# Patient Record
Sex: Female | Born: 1990
Health system: Southern US, Community
[De-identification: ages and names within clinical notes are randomized; demographics above are authoritative.]

## PROBLEM LIST (undated history)

## (undated) DIAGNOSIS — F410 Panic disorder [episodic paroxysmal anxiety] without agoraphobia: Secondary | ICD-10-CM

## (undated) DIAGNOSIS — F4310 Post-traumatic stress disorder, unspecified: Secondary | ICD-10-CM

## (undated) DIAGNOSIS — K602 Anal fissure, unspecified: Secondary | ICD-10-CM

## (undated) DIAGNOSIS — D126 Benign neoplasm of colon, unspecified: Secondary | ICD-10-CM

## (undated) DIAGNOSIS — F329 Major depressive disorder, single episode, unspecified: Secondary | ICD-10-CM

## (undated) DIAGNOSIS — F909 Attention-deficit hyperactivity disorder, unspecified type: Secondary | ICD-10-CM

## (undated) HISTORY — PX: NO PAST SURGERIES: SHX2092

## (undated) HISTORY — DX: Post-traumatic stress disorder, unspecified: F43.10

## (undated) HISTORY — DX: Attention-deficit hyperactivity disorder, unspecified type: F90.9

## (undated) HISTORY — DX: Major depressive disorder, single episode, unspecified: F32.9

---

## 2017-08-05 ENCOUNTER — Other Ambulatory Visit: Payer: Self-pay

## 2017-08-05 ENCOUNTER — Encounter (HOSPITAL_COMMUNITY): Payer: Self-pay | Admitting: *Deleted

## 2017-08-05 ENCOUNTER — Encounter (HOSPITAL_COMMUNITY): Payer: Self-pay | Admitting: Emergency Medicine

## 2017-08-05 ENCOUNTER — Emergency Department (HOSPITAL_COMMUNITY): Payer: Commercial Managed Care - PPO

## 2017-08-05 ENCOUNTER — Emergency Department (HOSPITAL_COMMUNITY)
Admission: EM | Admit: 2017-08-05 | Discharge: 2017-08-05 | Disposition: A | Discharge status: Other Acute Inpatient Hospital | Payer: Commercial Managed Care - PPO | Attending: Emergency Medicine | Admitting: Emergency Medicine

## 2017-08-05 ENCOUNTER — Inpatient Hospital Stay (HOSPITAL_COMMUNITY)
Admission: AD | Admit: 2017-08-05 | Discharge: 2017-08-09 | DRG: 885 | Disposition: A | Discharge status: Home / Self Care | Payer: Commercial Managed Care - PPO | Attending: Psychiatry | Admitting: Psychiatry

## 2017-08-05 DIAGNOSIS — F10129 Alcohol abuse with intoxication, unspecified: Secondary | ICD-10-CM | POA: Diagnosis present

## 2017-08-05 DIAGNOSIS — X749XXD Intentional self-harm by unspecified firearm discharge, subsequent encounter: Secondary | ICD-10-CM

## 2017-08-05 DIAGNOSIS — S41002A Unspecified open wound of left shoulder, initial encounter: Secondary | ICD-10-CM | POA: Insufficient documentation

## 2017-08-05 DIAGNOSIS — Y999 Unspecified external cause status: Secondary | ICD-10-CM | POA: Diagnosis not present

## 2017-08-05 DIAGNOSIS — F1099 Alcohol use, unspecified with unspecified alcohol-induced disorder: Secondary | ICD-10-CM | POA: Diagnosis not present

## 2017-08-05 DIAGNOSIS — F1210 Cannabis abuse, uncomplicated: Secondary | ICD-10-CM | POA: Diagnosis not present

## 2017-08-05 DIAGNOSIS — Y929 Unspecified place or not applicable: Secondary | ICD-10-CM | POA: Insufficient documentation

## 2017-08-05 DIAGNOSIS — F39 Unspecified mood [affective] disorder: Secondary | ICD-10-CM | POA: Diagnosis not present

## 2017-08-05 DIAGNOSIS — S42292B Other displaced fracture of upper end of left humerus, initial encounter for open fracture: Secondary | ICD-10-CM | POA: Insufficient documentation

## 2017-08-05 DIAGNOSIS — Y906 Blood alcohol level of 120-199 mg/100 ml: Secondary | ICD-10-CM | POA: Diagnosis present

## 2017-08-05 DIAGNOSIS — S41031A Puncture wound without foreign body of right shoulder, initial encounter: Secondary | ICD-10-CM | POA: Diagnosis not present

## 2017-08-05 DIAGNOSIS — F419 Anxiety disorder, unspecified: Secondary | ICD-10-CM | POA: Diagnosis not present

## 2017-08-05 DIAGNOSIS — F332 Major depressive disorder, recurrent severe without psychotic features: Principal | ICD-10-CM | POA: Diagnosis present

## 2017-08-05 DIAGNOSIS — R45851 Suicidal ideations: Secondary | ICD-10-CM | POA: Insufficient documentation

## 2017-08-05 DIAGNOSIS — T1491XA Suicide attempt, initial encounter: Secondary | ICD-10-CM | POA: Insufficient documentation

## 2017-08-05 DIAGNOSIS — S41002D Unspecified open wound of left shoulder, subsequent encounter: Secondary | ICD-10-CM | POA: Diagnosis not present

## 2017-08-05 DIAGNOSIS — S42252B Displaced fracture of greater tuberosity of left humerus, initial encounter for open fracture: Secondary | ICD-10-CM | POA: Diagnosis not present

## 2017-08-05 DIAGNOSIS — F514 Sleep terrors [night terrors]: Secondary | ICD-10-CM | POA: Diagnosis present

## 2017-08-05 DIAGNOSIS — X749XXA Intentional self-harm by unspecified firearm discharge, initial encounter: Secondary | ICD-10-CM | POA: Diagnosis not present

## 2017-08-05 DIAGNOSIS — Y9389 Activity, other specified: Secondary | ICD-10-CM | POA: Insufficient documentation

## 2017-08-05 DIAGNOSIS — X730XXA Intentional self-harm by shotgun discharge, initial encounter: Secondary | ICD-10-CM | POA: Diagnosis not present

## 2017-08-05 DIAGNOSIS — Z630 Problems in relationship with spouse or partner: Secondary | ICD-10-CM | POA: Diagnosis not present

## 2017-08-05 DIAGNOSIS — R450 Nervousness: Secondary | ICD-10-CM | POA: Diagnosis not present

## 2017-08-05 DIAGNOSIS — Z915 Personal history of self-harm: Secondary | ICD-10-CM

## 2017-08-05 DIAGNOSIS — F4310 Post-traumatic stress disorder, unspecified: Secondary | ICD-10-CM | POA: Diagnosis present

## 2017-08-05 DIAGNOSIS — W3400XA Accidental discharge from unspecified firearms or gun, initial encounter: Secondary | ICD-10-CM

## 2017-08-05 HISTORY — DX: Post-traumatic stress disorder, unspecified: F43.10

## 2017-08-05 LAB — TYPE AND SCREEN
UNIT DIVISION: 0
Unit division: 0

## 2017-08-05 LAB — CBC
HEMATOCRIT: 42.8 % (ref 36.0–46.0)
HEMOGLOBIN: 14.5 g/dL (ref 12.0–15.0)
MCH: 30.9 pg (ref 26.0–34.0)
MCHC: 33.9 g/dL (ref 30.0–36.0)
MCV: 91.1 fL (ref 78.0–100.0)
Platelets: 250 10*3/uL (ref 150–400)
RBC: 4.7 MIL/uL (ref 3.87–5.11)
RDW: 12.6 % (ref 11.5–15.5)
WBC: 9 10*3/uL (ref 4.0–10.5)

## 2017-08-05 LAB — URINALYSIS, ROUTINE W REFLEX MICROSCOPIC
Bacteria, UA: NONE SEEN
Bilirubin Urine: NEGATIVE
GLUCOSE, UA: NEGATIVE mg/dL
KETONES UR: 5 mg/dL — AB
LEUKOCYTES UA: NEGATIVE
NITRITE: NEGATIVE
PH: 5 (ref 5.0–8.0)
PROTEIN: NEGATIVE mg/dL
Specific Gravity, Urine: 1.012 (ref 1.005–1.030)

## 2017-08-05 LAB — COMPREHENSIVE METABOLIC PANEL
ALBUMIN: 4.6 g/dL (ref 3.5–5.0)
ALT: 24 U/L (ref 14–54)
AST: 25 U/L (ref 15–41)
Alkaline Phosphatase: 41 U/L (ref 38–126)
Anion gap: 12 (ref 5–15)
BILIRUBIN TOTAL: 0.6 mg/dL (ref 0.3–1.2)
BUN: 8 mg/dL (ref 6–20)
CO2: 19 mmol/L — ABNORMAL LOW (ref 22–32)
Calcium: 9.3 mg/dL (ref 8.9–10.3)
Chloride: 110 mmol/L (ref 101–111)
Creatinine, Ser: 0.78 mg/dL (ref 0.44–1.00)
GFR calc Af Amer: >60 mL/min (ref >60)
GLUCOSE: 101 mg/dL — AB (ref 65–99)
POTASSIUM: 3.9 mmol/L (ref 3.5–5.1)
Sodium: 141 mmol/L (ref 135–145)
TOTAL PROTEIN: 7.6 g/dL (ref 6.5–8.1)

## 2017-08-05 LAB — BPAM FFP
Blood Product Expiration Date: 201901012359
Blood Product Expiration Date: 201901012359
ISSUE DATE / TIME: 201901010143
ISSUE DATE / TIME: 201901010143
Unit Type and Rh: 6200
Unit Type and Rh: 6200

## 2017-08-05 LAB — I-STAT CG4 LACTIC ACID, ED: LACTIC ACID, VENOUS: 2.4 mmol/L — AB (ref 0.5–1.9)

## 2017-08-05 LAB — I-STAT CHEM 8, ED
BUN: 9 mg/dL (ref 6–20)
Calcium, Ion: 1.14 mmol/L — ABNORMAL LOW (ref 1.15–1.40)
Chloride: 110 mmol/L (ref 101–111)
Creatinine, Ser: 1 mg/dL (ref 0.44–1.00)
Glucose, Bld: 101 mg/dL — ABNORMAL HIGH (ref 65–99)
HCT: 44 % (ref 36.0–46.0)
Hemoglobin: 15 g/dL (ref 12.0–15.0)
Potassium: 3.9 mmol/L (ref 3.5–5.1)
SODIUM: 144 mmol/L (ref 135–145)
TCO2: 20 mmol/L — ABNORMAL LOW (ref 22–32)

## 2017-08-05 LAB — I-STAT BETA HCG BLOOD, ED (MC, WL, AP ONLY): I-stat hCG, quantitative: <5 m[IU]/mL (ref <5)

## 2017-08-05 LAB — PREPARE FRESH FROZEN PLASMA
Unit division: 0
Unit division: 0

## 2017-08-05 LAB — BPAM RBC
Blood Product Expiration Date: 201901072359
Blood Product Expiration Date: 201901072359
ISSUE DATE / TIME: 201901010648
ISSUE DATE / TIME: 201901011018
UNIT TYPE AND RH: 9500
Unit Type and Rh: 9500

## 2017-08-05 LAB — ACETAMINOPHEN LEVEL

## 2017-08-05 LAB — RAPID URINE DRUG SCREEN, HOSP PERFORMED
AMPHETAMINES: NOT DETECTED
BARBITURATES: NOT DETECTED
Benzodiazepines: NOT DETECTED
COCAINE: NOT DETECTED
Opiates: NOT DETECTED
TETRAHYDROCANNABINOL: NOT DETECTED

## 2017-08-05 LAB — ETHANOL: Alcohol, Ethyl (B): 163 mg/dL — ABNORMAL HIGH (ref <10)

## 2017-08-05 LAB — BLOOD PRODUCT ORDER (VERBAL) VERIFICATION

## 2017-08-05 LAB — SALICYLATE LEVEL: Salicylate Lvl: <7 mg/dL (ref 2.8–30.0)

## 2017-08-05 MED ORDER — DOXYCYCLINE HYCLATE 100 MG PO TABS
100.0000 mg | ORAL_TABLET | Freq: Two times a day (BID) | ORAL | Status: DC
  Administered 2017-08-05 (×2): 100 mg via ORAL
  Filled 2017-08-05 (×2): qty 1

## 2017-08-05 MED ORDER — SODIUM CHLORIDE 0.9 % IV BOLUS (SEPSIS)
1000.0000 mL | Freq: Once | INTRAVENOUS | Status: AC
  Administered 2017-08-05: 1000 mL via INTRAVENOUS

## 2017-08-05 MED ORDER — NAPROXEN 500 MG PO TABS
500.0000 mg | ORAL_TABLET | Freq: Two times a day (BID) | ORAL | Status: DC
  Administered 2017-08-05 – 2017-08-09 (×8): 500 mg via ORAL
  Filled 2017-08-05 (×14): qty 1

## 2017-08-05 MED ORDER — ACETAMINOPHEN 500 MG PO TABS
1000.0000 mg | ORAL_TABLET | Freq: Four times a day (QID) | ORAL | Status: DC | PRN
  Administered 2017-08-05: 1000 mg via ORAL
  Filled 2017-08-05: qty 2

## 2017-08-05 MED ORDER — CEPHALEXIN 500 MG PO CAPS
500.0000 mg | ORAL_CAPSULE | Freq: Four times a day (QID) | ORAL | Status: DC
  Administered 2017-08-05 – 2017-08-09 (×14): 500 mg via ORAL
  Filled 2017-08-05 (×4): qty 1
  Filled 2017-08-05: qty 2
  Filled 2017-08-05: qty 20
  Filled 2017-08-05: qty 1
  Filled 2017-08-05 (×2): qty 20
  Filled 2017-08-05 (×10): qty 1
  Filled 2017-08-05: qty 20
  Filled 2017-08-05 (×2): qty 1
  Filled 2017-08-05: qty 2
  Filled 2017-08-05 (×2): qty 1

## 2017-08-05 MED ORDER — TRAMADOL HCL 50 MG PO TABS
50.0000 mg | ORAL_TABLET | Freq: Four times a day (QID) | ORAL | Status: DC | PRN
  Administered 2017-08-05 – 2017-08-08 (×4): 50 mg via ORAL
  Filled 2017-08-05 (×4): qty 1

## 2017-08-05 MED ORDER — KETOROLAC TROMETHAMINE 30 MG/ML IJ SOLN
15.0000 mg | Freq: Once | INTRAMUSCULAR | Status: AC
  Administered 2017-08-05: 15 mg via INTRAVENOUS
  Filled 2017-08-05: qty 1

## 2017-08-05 MED ORDER — MAGNESIUM HYDROXIDE 400 MG/5ML PO SUSP: 30.0000 mL | Freq: Every day | ORAL | Status: DC | PRN

## 2017-08-05 MED ORDER — CEPHALEXIN 250 MG PO CAPS
500.0000 mg | ORAL_CAPSULE | Freq: Four times a day (QID) | ORAL | Status: DC
  Administered 2017-08-05 (×2): 500 mg via ORAL
  Filled 2017-08-05 (×2): qty 2

## 2017-08-05 MED ORDER — DOXYCYCLINE HYCLATE 100 MG PO TABS
100.0000 mg | ORAL_TABLET | Freq: Two times a day (BID) | ORAL | Status: AC
  Administered 2017-08-05 – 2017-08-07 (×5): 100 mg via ORAL
  Filled 2017-08-05 (×7): qty 1

## 2017-08-05 MED ORDER — ALUM & MAG HYDROXIDE-SIMETH 200-200-20 MG/5ML PO SUSP: 30.0000 mL | ORAL | Status: DC | PRN

## 2017-08-05 MED ORDER — TRAMADOL HCL 50 MG PO TABS
50.0000 mg | ORAL_TABLET | Freq: Four times a day (QID) | ORAL | Status: DC | PRN
Start: 2017-08-05 — End: 2017-08-05
  Administered 2017-08-05 (×2): 50 mg via ORAL
  Filled 2017-08-05 (×2): qty 1

## 2017-08-05 MED ORDER — TETANUS-DIPHTH-ACELL PERTUSSIS 5-2.5-18.5 LF-MCG/0.5 IM SUSP
0.5000 mL | Freq: Once | INTRAMUSCULAR | Status: AC
  Administered 2017-08-05: 0.5 mL via INTRAMUSCULAR

## 2017-08-05 MED ORDER — IBUPROFEN 400 MG PO TABS: 600.0000 mg | ORAL_TABLET | Freq: Four times a day (QID) | ORAL | Status: DC | PRN

## 2017-08-05 MED ORDER — CEFAZOLIN SODIUM-DEXTROSE 1-4 GM/50ML-% IV SOLN
1.0000 g | Freq: Once | INTRAVENOUS | Status: AC
  Administered 2017-08-05: 1 g via INTRAVENOUS

## 2017-08-05 NOTE — ED Notes (Signed)
Per request of Dr. Randal Buba, called [employer] - Non Emergent Line to see if IVC had been taken out on pt by [facility] officers that brought the pt in. Dispatcher for [employer] - Non Emergent Line stated that they can not see if any IVC papers had been taken out. I was then transferred to the [place] who stated no IVC papers have been taken out on the pt within the last 24 hours.

## 2017-08-05 NOTE — ED Provider Notes (Signed)
Under IVC by me   Accepted to [facility] this am   Joshua Soulier, MD 08/05/17 [id number]
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
## 2017-08-05 NOTE — Tx Team (Signed)
Initial Treatment Plan 08/05/2017 3:46 PM [name] KLK:[id number]    PATIENT STRESSORS: Marital or family conflict Substance abuse   PATIENT STRENGTHS: Ability for insight Average or above average intelligence Capable of independent living General fund of knowledge Motivation for treatment/growth   PATIENT IDENTIFIED PROBLEMS: Depression Suicidal thoughts "Need to re-find my coping mechanisms"                     DISCHARGE CRITERIA:  Ability to meet basic life and health needs Improved stabilization in mood, thinking, and/or behavior Reduction of life-threatening or endangering symptoms to within safe limits Verbal commitment to aftercare and medication compliance  PRELIMINARY DISCHARGE PLAN: Attend aftercare/continuing care group Return to previous living arrangement  PATIENT/FAMILY INVOLVEMENT: This treatment plan has been presented to and reviewed with the patient, [facility], and/or family member, .  The patient and family have been given the opportunity to ask questions and make suggestions.  Kalen Neidert, [place], [place] 08/05/2017, 3:46 PM

## 2017-08-05 NOTE — ED Notes (Deleted)
Chaplain paged @ [phone]. Per request of [employer] manager, only fiance is allowed to come back w/ chaplain to the consultation room.

## 2017-08-05 NOTE — Plan of Care (Signed)
Patient demonstrates understanding of prescribed medications. Patient has been compliant with medication regimen and has no questions or concerns at this time.
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
## 2017-08-05 NOTE — ED Notes (Signed)
Confirmed w/ the Magistrate @ [phone] that they have received the IVC papers.

## 2017-08-05 NOTE — Progress Notes (Signed)
Per Letitia Libra , [facility], patient has been accepted to [facility], bed 400-1 ; Accepting provider is Patriciaann Clan, NP; Attending provider is Dr. Parke Poisson.   Patient can arrive at 3:00pm. Number for report is [phone].  Gordan Payment, RN notified.    Radonna Ricker, MSW, [place] Clinical Social Worker (Disposition) [facility]  [phone]

## 2017-08-05 NOTE — ED Notes (Signed)
Attempted to call report to [facility] - requested for this RN to call back in 20 minutes.

## 2017-08-05 NOTE — ED Notes (Signed)
IVC papers filled out. Faxed to [facility] @ 0525. Faxed to the [place] @ 0526. Awaiting confirmations on both.

## 2017-08-05 NOTE — ED Notes (Signed)
Pt arrived to [facility] - wearing hospital gown and paper scrub pants. Pt replaced gown w/paper shirt as requested. Pt noted to be alert, oriented, cooperative, calm. Sitter w/pt.
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
## 2017-08-05 NOTE — ED Notes (Signed)
TTS in process 

## 2017-08-05 NOTE — Progress Notes (Signed)
Pt has dressing to L shoulder.  Upon examination there is entry and exit wound with minor, red drainage.  Pt sts she has pain lever of 8 and pain when manipulating her are.  Pt is capable of ADL's without assistance. Dressing changed after pt has shower.  Pt receives pain medication order.  Sling ordered for pt. Pt in bed resting at this time.  15 min checks for safety.  Pt remains safe

## 2017-08-05 NOTE — ED Notes (Signed)
TTS machine set up for Tele assessment

## 2017-08-05 NOTE — ED Notes (Signed)
IV removed from rt. AC, 2X2 applied w/ co band. Patient was given a Regular Diet for Lunch.

## 2017-08-05 NOTE — ED Notes (Signed)
CSI at bedside.
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
## 2017-08-05 NOTE — ED Notes (Signed)
Successful fax confirmations received on IVC papers that were faxed to [facility] and the [facility].

## 2017-08-05 NOTE — ED Notes (Signed)
Copy of IVC paperwork sent to Medical Records, original placed in folder for Magistrate, and all 3 copies on clipboard.

## 2017-08-05 NOTE — ED Notes (Signed)
Pt's belongings placed in locker #2, no other valuables found with belongings.
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
## 2017-08-05 NOTE — ED Notes (Signed)
Regular diet breakfast tray ordered @ [phone] w/ 'no sharps' requested.

## 2017-08-06 DIAGNOSIS — X749XXA Intentional self-harm by unspecified firearm discharge, initial encounter: Secondary | ICD-10-CM

## 2017-08-06 DIAGNOSIS — T1491XA Suicide attempt, initial encounter: Secondary | ICD-10-CM

## 2017-08-06 DIAGNOSIS — F332 Major depressive disorder, recurrent severe without psychotic features: Principal | ICD-10-CM

## 2017-08-06 DIAGNOSIS — Z630 Problems in relationship with spouse or partner: Secondary | ICD-10-CM

## 2017-08-06 DIAGNOSIS — S41031A Puncture wound without foreign body of right shoulder, initial encounter: Secondary | ICD-10-CM

## 2017-08-06 MED ORDER — SERTRALINE HCL 25 MG PO TABS
25.0000 mg | ORAL_TABLET | Freq: Every day | ORAL | Status: DC
  Administered 2017-08-06 – 2017-08-09 (×4): 25 mg via ORAL
  Filled 2017-08-06: qty 7
  Filled 2017-08-06 (×6): qty 1

## 2017-08-06 NOTE — Plan of Care (Signed)
Patient verbalizes understanding of information, education provided.  Patient has not engaged in self harm, denies thoughts to do so.
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
## 2017-08-06 NOTE — Progress Notes (Signed)
Nursing Progress Note: 7p-7a D: Pt currently presents with a pleasant affect and behavior. Pt states "I have to stay away from alcohol. I feel just fine. It's just when I drink do I have bad reactions" Interacting appropriately with the milieu. Pt reports good sleep during the previous night with current medication regimen. Pt did attend wrap-up group.  A: Pt provided with medications per providers orders. Pt's labs and vitals were monitored throughout the night. Pt supported emotionally and encouraged to express concerns and questions. Pt educated on medications.  R: Pt's safety ensured with 15 minute and environmental checks. Pt currently denies SI, HI, and AVH. Pt verbally contracts to seek staff if SI,HI, or AVH occurs and to consult with staff before acting on any harmful thoughts. Will continue to monitor.

## 2017-08-06 NOTE — BHH Group Notes (Signed)
[facility] Mental Health Association Group Therapy 08/06/2017 1:15pm  Type of Therapy: Mental Health Association Presentation  Participation Level: Active  Participation Quality: Attentive  Affect: Appropriate  Cognitive: Oriented  Insight: Developing/Improving  Engagement in Therapy: Engaged  Modes of Intervention: Discussion, Education and Socialization  Summary of Progress/Problems: [place] ([place] Speaker came to talk about his personal journey with mental health. The pt processed ways by which to relate to the speaker. [place] speaker provided handouts and educational information pertaining to groups and services offered by the [facility]. Pt was engaged in speaker's presentation and was receptive to resources provided.    Joanne Chars, LCSW 08/06/2017 4:08 PM
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
## 2017-08-06 NOTE — Progress Notes (Signed)
The patient mentioned in group that she had a good day overall since she was more sociable with her peers and because she colored as a means of coping. Her goal for tomorrow is to work on her coping skills and to address her anxiety.

## 2017-08-06 NOTE — Progress Notes (Signed)
Recreation Therapy Notes  Date: 08/06/17 Time: 0930 Location: [address] Dayroom  Group Topic: Stress Management  Goal Area(s) Addresses:  Patient will verbalize importance of using healthy stress management.  Patient will identify positive emotions associated with healthy stress management.   Intervention: Stress Management  Activity :  Body Scan Meditation.  LRT introduced the stress management technique of meditation.  LRT played a script that guided patients through a body scan that allowed patients to become aware of any sensations they may have been experiencing.  Education:  Stress Management, Discharge Planning.   Education Outcome: Acknowledges edcuation/In group clarification offered/Needs additional education  Clinical Observations/Feedback: Pt did not attend group.     Victorino Sparrow, LRT/CTRS         Victorino Sparrow A 08/06/2017 1:03 PM

## 2017-08-07 DIAGNOSIS — F1210 Cannabis abuse, uncomplicated: Secondary | ICD-10-CM

## 2017-08-07 DIAGNOSIS — R450 Nervousness: Secondary | ICD-10-CM

## 2017-08-07 DIAGNOSIS — F419 Anxiety disorder, unspecified: Secondary | ICD-10-CM

## 2017-08-07 DIAGNOSIS — F39 Unspecified mood [affective] disorder: Secondary | ICD-10-CM

## 2017-08-07 NOTE — Progress Notes (Signed)
Data. Patient denies SI/HI/AVH. Verbally contracts for safety on the unit and to come to staff before acting of any self harm thoughts/feelings.  Patient interacting well with staff and other patients. Affect is bright and patient reports that she is starting to redo her yoga and positive mantras. She also talked extensively about how she was aware that she was becoming increasingly anxious over the last weeks, but she ignored the warning signes, and even began drinking. Patient stated, "I really understand that I can't stop doing any of my positive coping skills. They keep me healthy. Even when I think I am over the hump, I have to keep doing them. I don't want to get to this place again. I don't want to get this far down again." Patient also talked about the concerns she has with the medications and the positive effects it is having, "I was really very hesitant in taking the medication, but this very small dose has made a huge difference in my mood, anxiety and the ruminating thoughts. And it hasn't changed who I am." On her self assessment patient reported 0/10 for depression and hopelessness and 1/10 for anxiety. Her goal for today is "Managing anxiety and coping skills." Wound to left shoulder cleaned and redressed. Some small amount of light red drainage noted. Wound is not swollen, or red.  Patient tolerated procedure well.  Action. Emotional support and encouragement offered. Education provided on medication, indications and side effect. Q 15 minute checks done for safety. Response. Safety on the unit maintained through 15 minute checks.  Medications taken as prescribed. Attended groups. Remained calm and appropriate through out shift.

## 2017-08-07 NOTE — Progress Notes (Signed)
[facility] MD Progress Note  08/07/2017 1:40 PM Tracy Munoz  MRN:  [id number]   Subjective: Patient reports that she is doing good today. She reports that she has been through depression and treatment in the past and has been on medications. She was hoping to be able to control it without medications, but she reports that alcohol has exacerbated the depressive episodes in the past and she claims she will not drink alcohol anymore. She denies any pain in her left shoulder at this time. She denies any SI/HI/AVH and contracts for safety. She wants to discharge but states understanding on why she can't leave yet. She reports that she feels a little "jittery and spacy" today but is still okay.  Objective: Patient's chart and findings reviewed and discussed with treatment team. Patient p-resent sin a pleasant and cooperative mood. She has an appropriate affect. She is making logical conversation and has significant insight about her mental illness. She would prefer to stay on the lower dose of Zoloft at this time to ensure she is not going to have any side effects. Patient has good support system and states that her boyfriend is going to stay with her after discharge.     Principal Problem: MDD (major depressive disorder), recurrent severe, without psychosis ([place]) Diagnosis:   Patient Active Problem List   Diagnosis Date Noted  . MDD (major depressive disorder), recurrent severe, without psychosis ([place]) [F33.2] 08/05/2017   Total Time spent with patient: 15 minutes  Past Psychiatric History: See H&P  Past Medical History:  Past Medical History:  Diagnosis Date  . PTSD (post-traumatic stress disorder)    History reviewed. No pertinent surgical history. Family History: History reviewed. No pertinent family history. Family Psychiatric  History: See H&P Social History:  Social History   Substance and Sexual Activity  Alcohol Use Yes   Comment: few times a week     Social History   Substance  and Sexual Activity  Drug Use Yes  . Types: Marijuana    Social History   Socioeconomic History  . Marital status: Single    Spouse name: None  . Number of children: None  . Years of education: None  . Highest education level: None  Social Needs  . Financial resource strain: None  . Food insecurity - worry: None  . Food insecurity - inability: None  . Transportation needs - medical: None  . Transportation needs - non-medical: None  Occupational History  . None  Tobacco Use  . Smoking status: Never Smoker  . Smokeless tobacco: Never Used  Substance and Sexual Activity  . Alcohol use: Yes    Comment: few times a week  . Drug use: Yes    Types: Marijuana  . Sexual activity: Yes  Other Topics Concern  . None  Social History Narrative  . None   Additional Social History:                         Sleep: Good  Appetite:  Good  Current Medications: Current Facility-Administered Medications  Medication Dose Route Frequency Provider Last Rate Last Dose  . alum & mag hydroxide-simeth (MAALOX/MYLANTA) 200-200-20 MG/5ML suspension 30 mL  30 mL Oral Q4H PRN Rankin, Shuvon B, NP      . cephALEXin (KEFLEX) capsule 500 mg  500 mg Oral Q6H Rankin, Shuvon B, NP   500 mg at 08/07/17 1310  . doxycycline (VIBRA-TABS) tablet 100 mg  100 mg Oral Q12H Rankin, Shuvon B,  NP   100 mg at 08/07/17 0752  . magnesium hydroxide (MILK OF MAGNESIA) suspension 30 mL  30 mL Oral Daily PRN Rankin, Shuvon B, NP      . naproxen (NAPROSYN) tablet 500 mg  500 mg Oral BID WC Patriciaann Clan E, PA-C   500 mg at 08/07/17 0752  . sertraline (ZOLOFT) tablet 25 mg  25 mg Oral Daily Izediuno, Laruth Bouchard, MD   25 mg at 08/07/17 0752  . traMADol (ULTRAM) tablet 50 mg  50 mg Oral Q6H PRN Rankin, Shuvon B, NP   50 mg at 08/06/17 2121    Lab Results: No results found for this or any previous visit (from the past 48 hour(s)).  Blood Alcohol level:  Lab Results  Component Value Date   ETH 163 (H) 30/86/5784     Metabolic Disorder Labs: No results found for: HGBA1C, MPG No results found for: PROLACTIN No results found for: CHOL, TRIG, HDL, CHOLHDL, VLDL, LDLCALC  Physical Findings: AIMS: Facial and Oral Movements Muscles of Facial Expression: None, normal Lips and Perioral Area: None, normal Jaw: None, normal Tongue: None, normal,Extremity Movements Upper (arms, wrists, hands, fingers): None, normal Lower (legs, knees, ankles, toes): None, normal, Trunk Movements Neck, shoulders, hips: None, normal, Overall Severity Severity of abnormal movements (highest score from questions above): None, normal Incapacitation due to abnormal movements: None, normal Patient's awareness of abnormal movements (rate only patient's report): No Awareness, Dental Status Current problems with teeth and/or dentures?: No Does patient usually wear dentures?: No  CIWA:    COWS:     Musculoskeletal: Strength & Muscle Tone: within normal limits limited on left shoulder Gait & Station: normal Patient leans: N/A  Psychiatric Specialty Exam: Physical Exam  Nursing note and vitals reviewed. Constitutional: She is oriented to person, place, and time. She appears well-developed and well-nourished.  Cardiovascular: Normal rate.  Respiratory: Effort normal.  Musculoskeletal: Normal range of motion.  Neurological: She is alert and oriented to person, place, and time.  Skin: Skin is warm.    Review of Systems  Constitutional: Negative.   HENT: Negative.   Eyes: Negative.   Respiratory: Negative.   Cardiovascular: Negative.   Gastrointestinal: Negative.   Genitourinary: Negative.   Musculoskeletal: Negative.   Skin: Negative.   Neurological: Negative.   Endo/Heme/Allergies: Negative.   Psychiatric/Behavioral: The patient is nervous/anxious.     Blood pressure 108/72, pulse 90, temperature 98.7 F (37.1 C), temperature source Oral, resp. rate 16, height 5\' 4"  (1.626 m), weight 64.9 kg (143 lb), last  menstrual period 07/22/2017.Body mass index is 24.55 kg/m.  General Appearance: Casual  Eye Contact:  Good  Speech:  Clear and Coherent and Normal Rate  Volume:  Normal  Mood:  Euthymic  Affect:  Appropriate  Thought Process:  Coherent, Goal Directed and Descriptions of Associations: Intact  Orientation:  Full (Time, Place, and Person)  Thought Content:  WDL  Suicidal Thoughts:  No  Homicidal Thoughts:  No  Memory:  Immediate;   Good Recent;   Good Remote;   Good  Judgement:  Good  Insight:  Good  Psychomotor Activity:  Normal  Concentration:  Concentration: Good and Attention Span: Good  Recall:  Good  Fund of Knowledge:  Good  Language:  Good  Akathisia:  No  Handed:  Right  AIMS (if indicated):     Assets:  Communication Skills Desire for Improvement Financial Resources/Insurance Housing Physical Health Social Support Transportation  ADL's:  Intact  Cognition:  WNL  Sleep:  Number of Hours: 6.5   Problems Addressed: MDD severe  Treatment Plan Summary: Daily contact with patient to assess and evaluate symptoms and progress in treatment, Medication management and Plan is to:  -Continue Zoloft 25 mg PO Daily for mood stability -Encourage group therapy participation  Lewis Shock, FNP 08/07/2017, 1:40 PM

## 2017-08-07 NOTE — BHH Group Notes (Signed)
[place] LCSW Group Therapy Note  Date/Time: 08/07/17, 1315  Type of Therapy/Topic:  Group Therapy:  Balance in Life  Participation Level:  moderate  Description of Group:    This group will address the concept of balance and how it feels and looks when one is unbalanced. Patients will be encouraged to process areas in their lives that are out of balance, and identify reasons for remaining unbalanced. Facilitators will guide patients utilizing problem- solving interventions to address and correct the stressor making their life unbalanced. Understanding and applying boundaries will be explored and addressed for obtaining  and maintaining a balanced life. Patients will be encouraged to explore ways to assertively make their unbalanced needs known to significant others in their lives, using other group members and facilitator for support and feedback.  Therapeutic Goals: 1. Patient will identify two or more emotions or situations they have that consume much of in their lives. 2. Patient will identify signs/triggers that life has become out of balance:  3. Patient will identify two ways to set boundaries in order to achieve balance in their lives:  4. Patient will demonstrate ability to communicate their needs through discussion and/or role plays  Summary of Patient Progress: Pt shared that mental/emotional and physical are areas that are out of balance in her life.  Pt was active in group discussion regarding ways to identify and move toward improving areas in life that are currently out of balance.           Therapeutic Modalities:   Cognitive Behavioral Therapy Solution-Focused Therapy Assertiveness Training  Lurline Idol, [place]

## 2017-08-07 NOTE — Progress Notes (Signed)
Adult Psychoeducational Group Note  Date:  08/07/2017 Time:  9:26 PM  Group Topic/Focus:  Wrap-Up Group:   The focus of this group is to help patients review their daily goal of treatment and discuss progress on daily workbooks.  Participation Level:  Active  Participation Quality:  Appropriate  Affect:  Anxious  Cognitive:  Appropriate  Insight: Appropriate  Engagement in Group:  Developing/Improving  Modes of Intervention:  Discussion  Additional Comments:  Patient stated that her goal was to be open to anxiety meds. Patient stated that they have "cut down on thoughts." Patients goal tomorrow is to work on a plan for when she is discharged.   Noel Christmas 08/07/2017, 9:26 PM

## 2017-08-08 DIAGNOSIS — F1099 Alcohol use, unspecified with unspecified alcohol-induced disorder: Secondary | ICD-10-CM

## 2017-08-08 NOTE — Progress Notes (Signed)
  [facility] Adult Case Management Discharge Plan :  Will you be returning to the same living situation after discharge:  Yes,  with boyfriend At discharge, do you have transportation home?: Yes,  boyfriend Do you have the ability to pay for your medications: Yes,  [facility]  Release of information consent forms completed and in the chart;  Patient's signature needed at discharge.  Patient to Follow up at: [facility]. Go on 08/22/2017.   Why:  Please attend your therapy appointment with Alyssa on Friday, 08/22/17, at 10:00am.  Please attend your medication appointment on 08/29/17, at 1:30pm with Crystal.  Please bring a copy of your hospital discharge paperwork. Contact information: [address] [place] [address] [phone]           Next level of care provider has access to [place] and Suicide Prevention discussed: Yes,  with boyfriend  Have you used any form of tobacco in the last 30 days? (Cigarettes, Smokeless Tobacco, Cigars, and/or Pipes): No  Has patient been referred to the Quitline?: N/A patient is not a smoker  Patient has been referred for addiction treatment: Yes  Joanne Chars, [place] 08/08/2017, 2:18 PM

## 2017-08-08 NOTE — Progress Notes (Signed)
[facility] MD Progress Note  08/08/2017 12:44 PM Tracy Munoz  MRN:  [id number]   Subjective: Patient reports that she feels well today. She slept well last night with no nightmares. She admits that she has had other "episodes" in the past, but has learned coping mechanisms that help keep her "grounded." She reports that her shoulder is healing well and she has had improved ROM today. Patient reports no adverse side effects to her medications. She thinks the "jittery" feeling she experienced yesterday was from the coffee she drank. She did not drink coffee this morning and has not felt "jittery" today. On a scale of 1-10 with 10 being the most severe, patient reports depression at a 0. She rates her anxiety at a 3 (up from 1-2 yesterday), but states she "feels more anxious" because she wants to go home. She wants to be discharged, but understands why she is here. She recognizes that this is "a continual process" and wants to keep working on herself to "continue improving." Her boyfriend has come to see her and they are in regular communication. She says they are working on their relationship, but doing well overall. She denies any SI/HI/AVH.    Objective: Patient's chart and findings were reviewed and discussed with treatment team. Patient is a pleasant young adult female, WDWN, in no acute distress and no gross abnormalities. She held good conversation and maintained eye contact throughout our visit. Patient has continually denied any increase in her medication and states that the current dose is adequate. CSW arranging discharge disposition for tomorrow.  Principal Problem: MDD (major depressive disorder), recurrent severe, without psychosis ([place]) Diagnosis:   Patient Active Problem List   Diagnosis Date Noted  . MDD (major depressive disorder), recurrent severe, without psychosis ([place]) [F33.2] 08/05/2017   Total Time spent with patient: 15 minutes  Past Psychiatric History: See H&P  Past  Medical History:  Past Medical History:  Diagnosis Date  . PTSD (post-traumatic stress disorder)    History reviewed. No pertinent surgical history. Family History: History reviewed. No pertinent family history. Family Psychiatric  History: See H&P Social History:  Social History   Substance and Sexual Activity  Alcohol Use Yes   Comment: few times a week     Social History   Substance and Sexual Activity  Drug Use Yes  . Types: Marijuana    Social History   Socioeconomic History  . Marital status: Single    Spouse name: None  . Number of children: None  . Years of education: None  . Highest education level: None  Social Needs  . Financial resource strain: None  . Food insecurity - worry: None  . Food insecurity - inability: None  . Transportation needs - medical: None  . Transportation needs - non-medical: None  Occupational History  . None  Tobacco Use  . Smoking status: Never Smoker  . Smokeless tobacco: Never Used  Substance and Sexual Activity  . Alcohol use: Yes    Comment: few times a week  . Drug use: Yes    Types: Marijuana  . Sexual activity: Yes  Other Topics Concern  . None  Social History Narrative  . None   Additional Social History:                         Sleep: Good  Appetite:  Good  Current Medications: Current Facility-Administered Medications  Medication Dose Route Frequency Provider Last Rate Last Dose  . alum &  mag hydroxide-simeth (MAALOX/MYLANTA) 200-200-20 MG/5ML suspension 30 mL  30 mL Oral Q4H PRN Rankin, Shuvon B, NP      . cephALEXin (KEFLEX) capsule 500 mg  500 mg Oral Q6H Rankin, Shuvon B, NP   500 mg at 08/08/17 3818  . magnesium hydroxide (MILK OF MAGNESIA) suspension 30 mL  30 mL Oral Daily PRN Rankin, Shuvon B, NP      . naproxen (NAPROSYN) tablet 500 mg  500 mg Oral BID WC Patriciaann Clan E, PA-C   500 mg at 08/08/17 0824  . sertraline (ZOLOFT) tablet 25 mg  25 mg Oral Daily Izediuno, Laruth Bouchard, MD   25 mg at  08/08/17 0824  . traMADol (ULTRAM) tablet 50 mg  50 mg Oral Q6H PRN Rankin, Shuvon B, NP   50 mg at 08/07/17 2126    Lab Results: No results found for this or any previous visit (from the past 48 hour(s)).  Blood Alcohol level:  Lab Results  Component Value Date   ETH 163 (H) 29/93/7169    Metabolic Disorder Labs: No results found for: HGBA1C, MPG No results found for: PROLACTIN No results found for: CHOL, TRIG, HDL, CHOLHDL, VLDL, LDLCALC  Physical Findings: AIMS: Facial and Oral Movements Muscles of Facial Expression: None, normal Lips and Perioral Area: None, normal Jaw: None, normal Tongue: None, normal,Extremity Movements Upper (arms, wrists, hands, fingers): None, normal Lower (legs, knees, ankles, toes): None, normal, Trunk Movements Neck, shoulders, hips: None, normal, Overall Severity Severity of abnormal movements (highest score from questions above): None, normal Incapacitation due to abnormal movements: None, normal Patient's awareness of abnormal movements (rate only patient's report): No Awareness, Dental Status Current problems with teeth and/or dentures?: No Does patient usually wear dentures?: No  CIWA:    COWS:     Musculoskeletal: Strength & Muscle Tone: within normal limits limited on left shoulder Gait & Station: normal Patient leans: N/A  Psychiatric Specialty Exam: Physical Exam  Nursing note and vitals reviewed. Constitutional: She is oriented to person, place, and time. She appears well-developed and well-nourished.  Cardiovascular: Normal rate.  Respiratory: Effort normal.  Musculoskeletal: Normal range of motion.  Neurological: She is alert and oriented to person, place, and time.  Skin: Skin is warm.    Review of Systems  Constitutional: Negative.   HENT: Negative.   Eyes: Negative.   Respiratory: Negative.   Cardiovascular: Negative.   Gastrointestinal: Negative.   Genitourinary: Negative.   Musculoskeletal: Negative.   Skin:  Negative.   Neurological: Negative.   Endo/Heme/Allergies: Negative.   Psychiatric/Behavioral: Negative for hallucinations. The patient is nervous/anxious (Reports being more anxious about being discharged).     Blood pressure 120/65, pulse 83, temperature 97.8 F (36.6 C), temperature source Oral, resp. rate 16, height 5\' 4"  (1.626 m), weight 64.9 kg (143 lb), last menstrual period 07/22/2017.Body mass index is 24.55 kg/m.  General Appearance: Casual  Eye Contact:  Good  Speech:  Clear and Coherent and Normal Rate  Volume:  Normal  Mood:  Euthymic  Affect:  Appropriate  Thought Process:  Coherent, Goal Directed and Descriptions of Associations: Intact  Orientation:  Full (Time, Place, and Person)  Thought Content:  WDL  Suicidal Thoughts:  No  Homicidal Thoughts:  No  Memory:  Immediate;   Good Recent;   Good Remote;   Good  Judgement:  Good  Insight:  Good  Psychomotor Activity:  Normal  Concentration:  Concentration: Good and Attention Span: Good  Recall:  Good  Fund of Knowledge:  Good  Language:  Good  Akathisia:  No  Handed:  Right  AIMS (if indicated):     Assets:  Communication Skills Desire for Improvement Financial Resources/Insurance Housing Physical Health Social Support Transportation  ADL's:  Intact  Cognition:  WNL  Sleep:  Number of Hours: 6.5   Problems Addressed: MDD severe  Treatment Plan Summary: Daily contact with patient to assess and evaluate symptoms and progress in treatment, Medication management and Plan is to:  -Continue Zoloft 25 mg PO Daily for mood stability -Encourage group therapy participation  Darla Lesches, Talitha Givens 08/08/2017, 12:44 PM

## 2017-08-08 NOTE — Progress Notes (Signed)
Adult Psychoeducational Group Note  Date:  08/08/2017 Time:  9:05 PM  Group Topic/Focus:  Wrap-Up Group:   The focus of this group is to help patients review their daily goal of treatment and discuss progress on daily workbooks.  Participation Level:  Active  Participation Quality:  Appropriate  Affect:  Appropriate  Cognitive:  Appropriate  Insight: Appropriate  Engagement in Group:  Engaged  Modes of Intervention:  Discussion  Additional Comments:  Patient attended group and said that her day was a 9.5.  Her coping skills for today were reading, coloring and self encouragement.   Kysen Wetherington W Julia Alkhatib 04/06/4461, 9:05 PM

## 2017-08-08 NOTE — Progress Notes (Signed)
D    Pt is pleasant and cooperative   She reports she is hoping she will for sure be discharged tomorrow    She is positive in her verbalizations and reports she is using her coping skills  A   Verbal support given    Medications administered and effectiveness monitored    Q 15 min checks R    Pt is safe at present
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
## 2017-08-08 NOTE — Progress Notes (Signed)
Recreation Therapy Notes  Date: 08/08/17 Time: 0930 Location: [address] Dayroom  Group Topic: Stress Management  Goal Area(s) Addresses:  Patient will verbalize importance of using healthy stress management.  Patient will identify positive emotions associated with healthy stress management.   Behavioral Response: Engaged  Intervention: Stress Management  Activity :  Meditation.  LRT introduced the stress management technique of meditation to patients.  Patients were to listen as the meditation guided them on forgiveness of self.  Education:  Stress Management, Discharge Planning.   Education Outcome: Acknowledges edcuation/In group clarification offered/Needs additional education  Clinical Observations/Feedback: Pt attended group.    Victorino Sparrow, LRT/CTRS         Victorino Sparrow A 08/08/2017 12:04 PM

## 2017-08-08 NOTE — BHH Group Notes (Signed)
  [facility] LCSW Group Therapy Note  Date/Time: 08/08/17, 1315  Type of Therapy/Topic:  Group Therapy:  Emotion Regulation  Participation Level:  Active   Mood:pleasant  Description of Group:    The purpose of this group is to assist patients in learning to regulate negative emotions and experience positive emotions. Patients will be guided to discuss ways in which they have been vulnerable to their negative emotions. These vulnerabilities will be juxtaposed with experiences of positive emotions or situations, and patients challenged to use positive emotions to combat negative ones. Special emphasis will be placed on coping with negative emotions in conflict situations, and patients will process healthy conflict resolution skills.  Therapeutic Goals: 1. Patient will identify two positive emotions or experiences to reflect on in order to balance out negative emotions:  2. Patient will label two or more emotions that they find the most difficult to experience:  3. Patient will be able to demonstrate positive conflict resolution skills through discussion or role plays:   Summary of Patient Progress:Pt identified anxiety and anger as emotions that are difficult for her to control.  Pt was active in group discussion regarding positive ways to manage difficult emotions.        Therapeutic Modalities:   Cognitive Behavioral Therapy Feelings Identification Dialectical Behavioral Therapy  Lurline Idol, LCSW

## 2017-08-08 NOTE — Tx Team (Signed)
Interdisciplinary Treatment and Diagnostic Plan Update  08/08/2017 Time of Session: [place] MRN: [id number]  Principal Diagnosis: MDD (major depressive disorder), recurrent severe, without psychosis ([place])  Secondary Diagnoses: Principal Problem:   MDD (major depressive disorder), recurrent severe, without psychosis ([place])   Current Medications:  Current Facility-Administered Medications  Medication Dose Route Frequency Provider Last Rate Last Dose  . alum & mag hydroxide-simeth (MAALOX/MYLANTA) 200-200-20 MG/5ML suspension 30 mL  30 mL Oral Q4H PRN Rankin, Shuvon B, NP      . cephALEXin (KEFLEX) capsule 500 mg  500 mg Oral Q6H Rankin, Shuvon B, NP   500 mg at 08/08/17 1316  . magnesium hydroxide (MILK OF MAGNESIA) suspension 30 mL  30 mL Oral Daily PRN Rankin, Shuvon B, NP      . naproxen (NAPROSYN) tablet 500 mg  500 mg Oral BID WC Patriciaann Clan E, PA-C   500 mg at 08/08/17 0824  . sertraline (ZOLOFT) tablet 25 mg  25 mg Oral Daily Izediuno, Laruth Bouchard, MD   25 mg at 08/08/17 0824  . traMADol (ULTRAM) tablet 50 mg  50 mg Oral Q6H PRN Rankin, Shuvon B, NP   50 mg at 08/07/17 2126   PTA Medications: No medications prior to admission.    Patient Stressors: Marital or family conflict Substance abuse  Patient Strengths: Ability for insight Average or above average intelligence Capable of independent living [employer] of knowledge Motivation for treatment/growth  Treatment Modalities: Medication Management, Group therapy, Case management,  1 to 1 session with clinician, Psychoeducation, Recreational therapy.   Physician Treatment Plan for Primary Diagnosis: MDD (major depressive disorder), recurrent severe, without psychosis ([place]) Long Term Goal(s): Improvement in symptoms so as ready for discharge Improvement in symptoms so as ready for discharge   Short Term Goals: Ability to identify changes in lifestyle to reduce recurrence of condition will  improve Ability to verbalize feelings will improve Ability to disclose and discuss suicidal ideas Ability to demonstrate self-control will improve Ability to identify and develop effective coping behaviors will improve Ability to maintain clinical measurements within normal limits will improve Compliance with prescribed medications will improve Ability to identify triggers associated with substance abuse/mental health issues will improve Ability to identify changes in lifestyle to reduce recurrence of condition will improve Ability to verbalize feelings will improve Ability to disclose and discuss suicidal ideas Ability to demonstrate self-control will improve Ability to identify and develop effective coping behaviors will improve Ability to maintain clinical measurements within normal limits will improve Compliance with prescribed medications will improve Ability to identify triggers associated with substance abuse/mental health issues will improve  Medication Management: Evaluate patient's response, side effects, and tolerance of medication regimen.  Therapeutic Interventions: 1 to 1 sessions, Unit Group sessions and Medication administration.  Evaluation of Outcomes: Progressing  Physician Treatment Plan for Secondary Diagnosis: Principal Problem:   MDD (major depressive disorder), recurrent severe, without psychosis ([place])  Long Term Goal(s): Improvement in symptoms so as ready for discharge Improvement in symptoms so as ready for discharge   Short Term Goals: Ability to identify changes in lifestyle to reduce recurrence of condition will improve Ability to verbalize feelings will improve Ability to disclose and discuss suicidal ideas Ability to demonstrate self-control will improve Ability to identify and develop effective coping behaviors will improve Ability to maintain clinical measurements within normal limits will improve Compliance with prescribed medications will  improve Ability to identify triggers associated with substance abuse/mental health issues will improve Ability to identify changes  in lifestyle to reduce recurrence of condition will improve Ability to verbalize feelings will improve Ability to disclose and discuss suicidal ideas Ability to demonstrate self-control will improve Ability to identify and develop effective coping behaviors will improve Ability to maintain clinical measurements within normal limits will improve Compliance with prescribed medications will improve Ability to identify triggers associated with substance abuse/mental health issues will improve     Medication Management: Evaluate patient's response, side effects, and tolerance of medication regimen.  Therapeutic Interventions: 1 to 1 sessions, Unit Group sessions and Medication administration.  Evaluation of Outcomes: Progressing   RN Treatment Plan for Primary Diagnosis: MDD (major depressive disorder), recurrent severe, without psychosis ([place]) Long Term Goal(s): Knowledge of disease and therapeutic regimen to maintain health will improve  Short Term Goals: Ability to identify and develop effective coping behaviors will improve and Compliance with prescribed medications will improve  Medication Management: RN will administer medications as ordered by provider, will assess and evaluate patient's response and provide education to patient for prescribed medication. RN will report any adverse and/or side effects to prescribing provider.  Therapeutic Interventions: 1 on 1 counseling sessions, Psychoeducation, Medication administration, Evaluate responses to treatment, Monitor vital signs and CBGs as ordered, Perform/monitor CIWA, COWS, AIMS and Fall Risk screenings as ordered, Perform wound care treatments as ordered.  Evaluation of Outcomes: Progressing   LCSW Treatment Plan for Primary Diagnosis: MDD (major depressive disorder), recurrent severe, without psychosis  ([place]) Long Term Goal(s): Safe transition to appropriate next level of care at discharge, Engage patient in therapeutic group addressing interpersonal concerns.  Short Term Goals: Engage patient in aftercare planning with referrals and resources, Increase social support and Increase skills for wellness and recovery  Therapeutic Interventions: Assess for all discharge needs, 1 to 1 time with Social worker, Explore available resources and support systems, Assess for adequacy in community support network, Educate family and significant other(s) on suicide prevention, Complete Psychosocial Assessment, Interpersonal group therapy.  Evaluation of Outcomes: Progressing   Progress in Treatment: Attending groups: Yes. Participating in groups: Yes. Taking medication as prescribed: Yes. Toleration medication: Yes. Family/Significant other contact made: Yes, individual(s) contacted:  boyfriend Patient understands diagnosis: Yes. Discussing patient identified problems/goals with staff: Yes. Medical problems stabilized or resolved: Yes. Denies suicidal/homicidal ideation: Yes. Issues/concerns per patient self-inventory: No. Other: none  New problem(s) identified: No, Describe:  none  New Short Term/Long Term Goal(s):  Discharge Plan or Barriers: [place]  Reason for Continuation of Hospitalization: Medication stabilization  Estimated Length of Stay: 1 day.  Attendees: Patient: 08/08/2017   Physician: Dr Sanjuana Letters, MD 08/08/2017   Nursing: Desma Paganini, RN 08/08/2017   RN Care Manager: 08/08/2017   Social Worker: Lurline Idol, LCSW 08/08/2017   Recreational Therapist:  08/08/2017   Other:  08/08/2017   Other:  08/08/2017   Other: 08/08/2017     Scribe for Treatment Team: Joanne Chars, [place] 08/08/2017 2:24 PM
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
## 2017-08-09 MED ORDER — CEPHALEXIN 500 MG PO CAPS: 500.0000 mg | ORAL_CAPSULE | Freq: Four times a day (QID) | ORAL | 0 refills | Status: DC

## 2017-08-09 MED ORDER — SERTRALINE HCL 25 MG PO TABS: 25.0000 mg | ORAL_TABLET | Freq: Every day | ORAL | 0 refills | Status: DC

## 2017-08-09 NOTE — Progress Notes (Signed)
D Pt is observed by writer standing at the med window.She makes direct eye contact. She says " I'm going home today and I can't wait". A She completes her daily assessment and this she wrote she denies SI today and she rated her depression, hopelessness and anxiety " 0/0/0", respectively. Safety is in plae.
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
## 2017-08-09 NOTE — Progress Notes (Signed)
Pt given  dc instructions  by writer  Pt stated understanding and willingness to comply. . Pt given prescriptions, cc of dc instructions ( SAR, AVS, SSP and transition record) and then pt was escorted top bldg entrance for dc. Pt writes she denies SI today and that she is ready for her dc and that she understnads dc instructions
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
## 2017-08-09 NOTE — [facility] Group Notes (Signed)
[facility] LCSW Group Therapy Note  Date/Time:    08/09/2017 10:15-11:15AM  Type of Therapy and Topic:  Group Therapy:  Healthy vs Unhealthy Coping Skills  Participation Level:  Active   Description of Group:  The focus of this group was to determine what healthy coping techniques would be helpful in coping with various problems in the coming New Year. Patients were guided in becoming aware of the differences between healthy and unhealthy coping techniques.  Patients were asked to identify 1-2 healthy coping skills they would like to learn to use more effectively, and many mentioned self-acceptance, breathing, positive affirmations and walking away.  Therapeutic Goals 1. Patients learned learned the difference between healthy vs unhealthy coping techniques 2. Each patient identified 1 healthy coping skill they would like to become more familiar with and use throughout the coming year       3.   Patients discussed specific plans for how to incorporate that new skill into their daily lives 4.   Patients provided support and ideas to each other       5.   CSW gave a visual example of boundary setting that encouraged patients to use boundaries in a healthy manner to create healthier relationships.  Summary of Patient Progress: During group, patient expressed that she wants to use more positive affirmations throughout the year to improve her life.  She was very interactive with others and supportive in several instances.  She smiled broadly throughout group.   Therapeutic Modalities Cognitive Behavioral Therapy Motivational Interviewing   Selmer Dominion, LCSW 08/09/2017, 12:25 PM

## 2017-08-14 ENCOUNTER — Ambulatory Visit: Payer: Commercial Managed Care - PPO | Admitting: Nurse Practitioner

## 2017-08-14 ENCOUNTER — Encounter: Payer: Self-pay | Admitting: Nurse Practitioner

## 2017-08-14 VITALS — BP 110/86 | HR 92 | Temp 98.3°F | Ht 68.0 in | Wt 141.0 lb

## 2017-08-14 DIAGNOSIS — M2560 Stiffness of unspecified joint, not elsewhere classified: Secondary | ICD-10-CM

## 2017-08-14 DIAGNOSIS — T1491XA Suicide attempt, initial encounter: Secondary | ICD-10-CM | POA: Insufficient documentation

## 2017-08-14 DIAGNOSIS — W3400XA Accidental discharge from unspecified firearms or gun, initial encounter: Secondary | ICD-10-CM | POA: Diagnosis not present

## 2017-08-14 DIAGNOSIS — S41002A Unspecified open wound of left shoulder, initial encounter: Secondary | ICD-10-CM

## 2017-08-14 DIAGNOSIS — Z9151 Personal history of suicidal behavior: Secondary | ICD-10-CM | POA: Insufficient documentation

## 2017-08-14 DIAGNOSIS — S41032A Puncture wound without foreign body of left shoulder, initial encounter: Secondary | ICD-10-CM | POA: Insufficient documentation

## 2017-08-14 HISTORY — DX: Puncture wound without foreign body of left shoulder, initial encounter: S41.032A

## 2017-08-14 MED ORDER — DOXYCYCLINE HYCLATE 100 MG PO TABS: 100.0000 mg | ORAL_TABLET | Freq: Two times a day (BID) | ORAL | 0 refills | Status: AC
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
## 2017-08-14 NOTE — Patient Instructions (Addendum)
Keep wound clean dry as much as possible.  Clean with water and soap. Change dressing 1-2times a day.  You will be contacted to schedule appt with ortho.   Wound Care, Adult Taking care of your wound properly can help to prevent pain and infection. It can also help your wound to heal more quickly. How is this treated? Wound care  Follow instructions from your health care provider about how to take care of your wound. Make sure you: ? Wash your hands with soap and water before you change the bandage (dressing). If soap and water are not available, use hand sanitizer. ? Change your dressing as told by your health care provider. ? Leave stitches (sutures), skin glue, or adhesive strips in place. These skin closures may need to stay in place for 2 weeks or longer. If adhesive strip edges start to loosen and curl up, you may trim the loose edges. Do not remove adhesive strips completely unless your health care provider tells you to do that.  Check your wound area every day for signs of infection. Check for: ? More redness, swelling, or pain. ? More fluid or blood. ? Warmth. ? Pus or a bad smell.  Ask your health care provider if you should clean the wound with mild soap and water. Doing this may include: ? Using a clean towel to pat the wound dry after cleaning it. Do not rub or scrub the wound. ? Applying a cream or ointment. Do this only as told by your health care provider. ? Covering the incision with a clean dressing.  Ask your health care provider when you can leave the wound uncovered. Medicines   If you were prescribed an antibiotic medicine, cream, or ointment, take or use the antibiotic as told by your health care provider. Do not stop taking or using the antibiotic even if your condition improves.  Take over-the-counter and prescription medicines only as told by your health care provider. If you were prescribed pain medicine, take it at least 30 minutes before doing any wound  care or as told by your health care provider. General instructions  Return to your normal activities as told by your health care provider. Ask your health care provider what activities are safe.  Do not scratch or pick at the wound.  Keep all follow-up visits as told by your health care provider. This is important.  Eat a diet that includes protein, vitamin A, vitamin C, and other nutrient-rich foods. These help the wound heal: ? Protein-rich foods include meat, dairy, beans, nuts, and other sources. ? Vitamin A-rich foods include carrots and dark green, leafy vegetables. ? Vitamin C-rich foods include citrus, tomatoes, and other fruits and vegetables. ? Nutrient-rich foods have protein, carbohydrates, fat, vitamins, or minerals. Eat a variety of healthy foods including vegetables, fruits, and whole grains. Contact a health care provider if:  You received a tetanus shot and you have swelling, severe pain, redness, or bleeding at the injection site.  Your pain is not controlled with medicine.  You have more redness, swelling, or pain around the wound.  You have more fluid or blood coming from the wound.  Your wound feels warm to the touch.  You have pus or a bad smell coming from the wound.  You have a fever or chills.  You are nauseous or you vomit.  You are dizzy. Get help right away if:  You have a red streak going away from your wound.  The edges of the  wound open up and separate.  Your wound is bleeding and the bleeding does not stop with gentle pressure.  You have a rash.  You faint.  You have trouble breathing. This information is not intended to replace advice given to you by your health care provider. Make sure you discuss any questions you have with your health care provider. Document Released: 04/30/2008 Document Revised: 03/20/2016 Document Reviewed: 02/06/2016 Elsevier Interactive Patient Education  2017 [employer].
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
## 2017-08-22 ENCOUNTER — Ambulatory Visit: Payer: Self-pay | Admitting: Nurse Practitioner

## 2017-08-25 ENCOUNTER — Encounter (INDEPENDENT_AMBULATORY_CARE_PROVIDER_SITE_OTHER): Payer: Self-pay | Admitting: Orthopaedic Surgery

## 2017-08-25 ENCOUNTER — Ambulatory Visit (INDEPENDENT_AMBULATORY_CARE_PROVIDER_SITE_OTHER): Payer: Commercial Managed Care - PPO | Admitting: Orthopaedic Surgery

## 2017-08-25 VITALS — BP 120/82 | HR 93 | Resp 12 | Ht 68.0 in | Wt 141.0 lb

## 2017-08-25 DIAGNOSIS — M25512 Pain in left shoulder: Secondary | ICD-10-CM | POA: Diagnosis not present
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
## 2017-09-19 ENCOUNTER — Encounter (INDEPENDENT_AMBULATORY_CARE_PROVIDER_SITE_OTHER): Payer: Self-pay | Admitting: Orthopaedic Surgery

## 2017-09-19 ENCOUNTER — Ambulatory Visit (INDEPENDENT_AMBULATORY_CARE_PROVIDER_SITE_OTHER): Payer: Commercial Managed Care - PPO | Admitting: Orthopaedic Surgery

## 2017-09-19 VITALS — BP 97/73 | HR 73 | Wt 141.0 lb

## 2017-09-19 DIAGNOSIS — M25512 Pain in left shoulder: Secondary | ICD-10-CM | POA: Diagnosis not present
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
## 2018-08-19 ENCOUNTER — Ambulatory Visit: Payer: Commercial Managed Care - PPO | Admitting: Psychology

## 2018-08-19 DIAGNOSIS — F603 Borderline personality disorder: Secondary | ICD-10-CM | POA: Diagnosis not present

## 2018-08-19 DIAGNOSIS — F331 Major depressive disorder, recurrent, moderate: Secondary | ICD-10-CM | POA: Diagnosis not present

## 2018-09-10 ENCOUNTER — Ambulatory Visit: Payer: Commercial Managed Care - PPO | Admitting: Psychology

## 2018-09-10 DIAGNOSIS — F331 Major depressive disorder, recurrent, moderate: Secondary | ICD-10-CM | POA: Diagnosis not present

## 2018-09-22 ENCOUNTER — Ambulatory Visit: Payer: Commercial Managed Care - PPO | Admitting: Psychology

## 2018-09-22 DIAGNOSIS — F331 Major depressive disorder, recurrent, moderate: Secondary | ICD-10-CM

## 2018-10-15 ENCOUNTER — Other Ambulatory Visit: Payer: Self-pay

## 2018-10-15 ENCOUNTER — Ambulatory Visit (INDEPENDENT_AMBULATORY_CARE_PROVIDER_SITE_OTHER): Payer: Commercial Managed Care - PPO | Admitting: Psychology

## 2018-10-15 DIAGNOSIS — F331 Major depressive disorder, recurrent, moderate: Secondary | ICD-10-CM
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
## 2018-10-29 ENCOUNTER — Ambulatory Visit: Payer: Commercial Managed Care - PPO | Admitting: Psychology

## 2018-11-20 ENCOUNTER — Ambulatory Visit: Payer: Commercial Managed Care - PPO | Admitting: Psychology

## 2018-11-23 ENCOUNTER — Telehealth: Payer: Self-pay | Admitting: Nurse Practitioner

## 2018-11-23 NOTE — Telephone Encounter (Signed)
Called on behalf of Wilfred Lacy because it has been a while since last appt, voicemail was full

## 2018-12-01 ENCOUNTER — Ambulatory Visit (INDEPENDENT_AMBULATORY_CARE_PROVIDER_SITE_OTHER): Payer: Commercial Managed Care - PPO | Admitting: Psychology

## 2018-12-01 DIAGNOSIS — F331 Major depressive disorder, recurrent, moderate: Secondary | ICD-10-CM | POA: Diagnosis not present

## 2018-12-22 ENCOUNTER — Ambulatory Visit (INDEPENDENT_AMBULATORY_CARE_PROVIDER_SITE_OTHER): Payer: Commercial Managed Care - PPO | Admitting: Psychology

## 2018-12-22 DIAGNOSIS — F331 Major depressive disorder, recurrent, moderate: Secondary | ICD-10-CM

## 2019-01-07 ENCOUNTER — Ambulatory Visit: Payer: Self-pay | Admitting: Psychology

## 2019-01-19 ENCOUNTER — Ambulatory Visit (INDEPENDENT_AMBULATORY_CARE_PROVIDER_SITE_OTHER): Payer: Commercial Managed Care - PPO | Admitting: Psychology

## 2019-01-19 DIAGNOSIS — F331 Major depressive disorder, recurrent, moderate: Secondary | ICD-10-CM | POA: Diagnosis not present

## 2019-02-11 ENCOUNTER — Ambulatory Visit (INDEPENDENT_AMBULATORY_CARE_PROVIDER_SITE_OTHER): Payer: Commercial Managed Care - PPO | Admitting: Nurse Practitioner

## 2019-02-11 ENCOUNTER — Other Ambulatory Visit: Payer: Self-pay

## 2019-02-11 ENCOUNTER — Encounter: Payer: Self-pay | Admitting: Nurse Practitioner

## 2019-02-11 VITALS — Ht 68.0 in | Wt 141.8 lb

## 2019-02-11 DIAGNOSIS — R110 Nausea: Secondary | ICD-10-CM | POA: Diagnosis not present

## 2019-02-11 DIAGNOSIS — R42 Dizziness and giddiness: Secondary | ICD-10-CM | POA: Diagnosis not present

## 2019-02-11 DIAGNOSIS — R1030 Lower abdominal pain, unspecified: Secondary | ICD-10-CM | POA: Diagnosis not present

## 2019-02-11 DIAGNOSIS — N920 Excessive and frequent menstruation with regular cycle: Secondary | ICD-10-CM | POA: Diagnosis not present

## 2019-02-11 NOTE — Progress Notes (Signed)
Virtual Visit via Video Note  I connected with Tracy Munoz on 02/11/19 at  4:00 PM EDT by a video enabled telemedicine application and verified that I am speaking with the correct person using two identifiers.  Location: Patient: Home Provider: Office   I discussed the limitations of evaluation and management by telemedicine and the availability of in person appointments. The patient expressed understanding and agreed to proceed.  CC: pt is c/o abd pain,vomit and nausea,weak/started yesterday after she took her med (sertaline). pt mention hx of plycystic ovarian syndrom--no check up for couple years, last 5 month her perioid has not been regular.  History of Present Illness: Abdominal Pain This is a recurrent problem. The current episode started more than 1 year ago. The onset quality is gradual. The problem occurs intermittently. The problem has been waxing and waning. The pain is located in the suprapubic region. The quality of the pain is a sensation of fullness, colicky and cramping. The abdominal pain does not radiate. Associated symptoms include nausea. Pertinent negatives include no anorexia, arthralgias, belching, constipation, diarrhea, dysuria, fever, flatus, frequency, headaches, hematochezia, hematuria, melena, myalgias, vomiting or weight loss. Associated symptoms comments: Fatigue, dizziness and presyncopal episodes once a month. No tinnitus, no headache.. Exacerbated by: menstural cycle. The pain is relieved by nothing. She has tried nothing for the symptoms.  reports heavy menstrual flow for first 3days. LMP 3weeks ago, regular, use of condoms Reports hx of PCOS, last PAP 60yrs ago (normal per patient). Last presyncopal episode was 2months ago.   Observations/Objective: Physical Exam  Constitutional: She is oriented to person, place, and time. No distress.  Pulmonary/Chest: Effort normal.  Neurological: She is alert and oriented to person, place, and time.   Psychiatric: She has a normal mood and affect. Her behavior is normal. Thought content normal.   Assessment and Plan: Tylicia was seen today for abdominal pain.  Diagnoses and all orders for this visit:  Nausea -     POCT urine pregnancy -     POCT urinalysis dipstick  Dizziness -     POCT urine pregnancy -     CBC w/Diff; Future -     Iron, TIBC and Ferritin Panel; Future -     Basic metabolic panel; Future -     TSH; Future  Lower abdominal pain -     POCT urine pregnancy -     POCT urinalysis dipstick  Menorrhagia with regular cycle -     CBC w/Diff; Future -     Iron, TIBC and Ferritin Panel; Future -     TSH; Future   Follow Up Instructions: Go to lab tomorrow at 11:15am for urine collection and blood draw. Maintain adequate oral hydration   I discussed the assessment and treatment plan with the patient. The patient was provided an opportunity to ask questions and all were answered. The patient agreed with the plan and demonstrated an understanding of the instructions.   The patient was advised to call back or seek an in-person evaluation if the symptoms worsen or if the condition fails to improve as anticipated.  Wilfred Lacy, NP

## 2019-02-12 ENCOUNTER — Other Ambulatory Visit: Payer: Commercial Managed Care - PPO

## 2019-02-12 DIAGNOSIS — N920 Excessive and frequent menstruation with regular cycle: Secondary | ICD-10-CM

## 2019-02-12 DIAGNOSIS — R42 Dizziness and giddiness: Secondary | ICD-10-CM

## 2019-02-12 LAB — POCT URINALYSIS DIPSTICK
Bilirubin, UA: NEGATIVE
Blood, UA: NEGATIVE
Glucose, UA: NEGATIVE
Ketones, UA: NEGATIVE
Leukocytes, UA: NEGATIVE
Nitrite, UA: NEGATIVE
Protein, UA: NEGATIVE
Spec Grav, UA: 1.01 (ref 1.010–1.025)
Urobilinogen, UA: 0.2 E.U./dL
pH, UA: 7 (ref 5.0–8.0)

## 2019-02-12 LAB — POCT URINE PREGNANCY: Preg Test, Ur: NEGATIVE

## 2019-02-13 LAB — BASIC METABOLIC PANEL
BUN: 11 mg/dL (ref 7–25)
CO2: 25 mmol/L (ref 20–32)
Calcium: 9.8 mg/dL (ref 8.6–10.2)
Chloride: 103 mmol/L (ref 98–110)
Creat: 0.72 mg/dL (ref 0.50–1.10)
Glucose, Bld: 107 mg/dL — ABNORMAL HIGH (ref 65–99)
Potassium: 4.6 mmol/L (ref 3.5–5.3)
Sodium: 136 mmol/L (ref 135–146)

## 2019-02-13 LAB — IRON,TIBC AND FERRITIN PANEL
%SAT: 42 % (calc) (ref 16–45)
Ferritin: 99 ng/mL (ref 16–154)
Iron: 116 ug/dL (ref 40–190)
TIBC: 279 mcg/dL (calc) (ref 250–450)

## 2019-02-13 LAB — TSH: TSH: 1.87 mIU/L

## 2019-02-13 LAB — CBC WITH DIFFERENTIAL/PLATELET
Absolute Monocytes: 549 cells/uL (ref 200–950)
Basophils Absolute: 57 cells/uL (ref 0–200)
Basophils Relative: 0.7 %
Eosinophils Absolute: 312 cells/uL (ref 15–500)
Eosinophils Relative: 3.8 %
HCT: 43.2 % (ref 35.0–45.0)
Hemoglobin: 14.8 g/dL (ref 11.7–15.5)
Lymphs Abs: 2665 cells/uL (ref 850–3900)
MCH: 31.5 pg (ref 27.0–33.0)
MCHC: 34.3 g/dL (ref 32.0–36.0)
MCV: 91.9 fL (ref 80.0–100.0)
MPV: 10.5 fL (ref 7.5–12.5)
Monocytes Relative: 6.7 %
Neutro Abs: 4617 cells/uL (ref 1500–7800)
Neutrophils Relative %: 56.3 %
Platelets: 280 10*3/uL (ref 140–400)
RBC: 4.7 10*6/uL (ref 3.80–5.10)
RDW: 11.5 % (ref 11.0–15.0)
Total Lymphocyte: 32.5 %
WBC: 8.2 10*3/uL (ref 3.8–10.8)

## 2019-02-15 ENCOUNTER — Encounter: Payer: Self-pay | Admitting: Nurse Practitioner

## 2019-02-15 NOTE — Patient Instructions (Signed)
Normal lab results.  No particular explanation for symptoms at this time. Maintain adequate oral hydration

## 2019-02-22 ENCOUNTER — Ambulatory Visit (INDEPENDENT_AMBULATORY_CARE_PROVIDER_SITE_OTHER): Payer: Commercial Managed Care - PPO | Admitting: Psychology

## 2019-02-22 DIAGNOSIS — F331 Major depressive disorder, recurrent, moderate: Secondary | ICD-10-CM

## 2019-03-01 ENCOUNTER — Telehealth: Payer: Self-pay | Admitting: Nurse Practitioner

## 2019-03-01 DIAGNOSIS — Z20828 Contact with and (suspected) exposure to other viral communicable diseases: Secondary | ICD-10-CM

## 2019-03-01 DIAGNOSIS — Z20822 Contact with and (suspected) exposure to covid-19: Secondary | ICD-10-CM

## 2019-03-01 NOTE — Telephone Encounter (Signed)
Spoke with the pt, she is coming [place] from [place] right now (Left on Friday[place]Monday--[place] beach--just family). Pt stated she just found out from her work place that one of her co-worker tested positive for covid 19. Pt is very concern doesn't want to put her mom at risk if she has something and [facility]. Pt denied any symptoms right now but headache from stress,sneezing from allergy,nausea/abd pain/vomits at time due to PCOS.  Please advise.    Copied from [place] [id number]. Topic: General - Other >> Mar 01, 2019  2:41 PM Leward Quan A wrote: Reason for CRM: Patient called to say that someone in her office tested positive for covid and she will go get tested on 03/02/2019 asking can orders be placed please [facility]

## 2019-03-02 ENCOUNTER — Encounter: Payer: Self-pay | Admitting: Nurse Practitioner

## 2019-03-02 ENCOUNTER — Other Ambulatory Visit: Payer: Self-pay

## 2019-03-02 ENCOUNTER — Telehealth: Payer: Self-pay

## 2019-03-02 DIAGNOSIS — Z20822 Contact with and (suspected) exposure to covid-19: Secondary | ICD-10-CM

## 2019-03-02 NOTE — Telephone Encounter (Signed)
Pt aware/thx dmf 

## 2019-03-02 NOTE — Telephone Encounter (Signed)
Pt is aware.  

## 2019-03-02 NOTE — Telephone Encounter (Signed)
CN-Pt states that she needs a note for work stating that must work from home until [employer] test is resulted/plz advise/thx dmf

## 2019-03-02 NOTE — Telephone Encounter (Signed)
Looks like order was already placed

## 2019-03-02 NOTE — Telephone Encounter (Signed)
Work note is available through [employer].

## 2019-03-04 LAB — NOVEL CORONAVIRUS, NAA: SARS-CoV-2, NAA: NOT DETECTED

## 2019-03-11 ENCOUNTER — Ambulatory Visit (INDEPENDENT_AMBULATORY_CARE_PROVIDER_SITE_OTHER): Payer: Commercial Managed Care - PPO | Admitting: Psychology

## 2019-03-11 DIAGNOSIS — F331 Major depressive disorder, recurrent, moderate: Secondary | ICD-10-CM | POA: Diagnosis not present

## 2019-03-25 ENCOUNTER — Ambulatory Visit (INDEPENDENT_AMBULATORY_CARE_PROVIDER_SITE_OTHER): Payer: Commercial Managed Care - PPO | Admitting: Psychology

## 2019-03-25 DIAGNOSIS — F331 Major depressive disorder, recurrent, moderate: Secondary | ICD-10-CM

## 2019-04-13 ENCOUNTER — Ambulatory Visit: Payer: Commercial Managed Care - PPO | Admitting: Psychology

## 2019-05-03 ENCOUNTER — Ambulatory Visit: Payer: Commercial Managed Care - PPO | Admitting: Psychology

## 2019-06-07 ENCOUNTER — Ambulatory Visit: Payer: Commercial Managed Care - PPO | Admitting: Psychology

## 2019-06-14 ENCOUNTER — Encounter (HOSPITAL_BASED_OUTPATIENT_CLINIC_OR_DEPARTMENT_OTHER): Payer: Self-pay

## 2019-06-14 ENCOUNTER — Other Ambulatory Visit: Payer: Self-pay

## 2019-06-14 ENCOUNTER — Emergency Department (HOSPITAL_BASED_OUTPATIENT_CLINIC_OR_DEPARTMENT_OTHER): Payer: Commercial Managed Care - PPO

## 2019-06-14 ENCOUNTER — Ambulatory Visit: Payer: Self-pay | Admitting: *Deleted

## 2019-06-14 ENCOUNTER — Emergency Department (HOSPITAL_BASED_OUTPATIENT_CLINIC_OR_DEPARTMENT_OTHER)
Admission: EM | Admit: 2019-06-14 | Discharge: 2019-06-14 | Disposition: A | Discharge status: Home / Self Care | Payer: Commercial Managed Care - PPO | Attending: Emergency Medicine | Admitting: Emergency Medicine

## 2019-06-14 DIAGNOSIS — Z79899 Other long term (current) drug therapy: Secondary | ICD-10-CM | POA: Insufficient documentation

## 2019-06-14 DIAGNOSIS — R079 Chest pain, unspecified: Secondary | ICD-10-CM

## 2019-06-14 DIAGNOSIS — F410 Panic disorder [episodic paroxysmal anxiety] without agoraphobia: Secondary | ICD-10-CM | POA: Insufficient documentation

## 2019-06-14 HISTORY — DX: Panic disorder (episodic paroxysmal anxiety): F41.0

## 2019-06-14 LAB — CBC
HCT: 41.3 % (ref 36.0–46.0)
Hemoglobin: 13.9 g/dL (ref 12.0–15.0)
MCH: 31.3 pg (ref 26.0–34.0)
MCHC: 33.7 g/dL (ref 30.0–36.0)
MCV: 93 fL (ref 80.0–100.0)
Platelets: 243 10*3/uL (ref 150–400)
RBC: 4.44 MIL/uL (ref 3.87–5.11)
RDW: 11.5 % (ref 11.5–15.5)
WBC: 7.5 10*3/uL (ref 4.0–10.5)
nRBC: 0 % (ref 0.0–0.2)

## 2019-06-14 LAB — BASIC METABOLIC PANEL
Anion gap: 11 (ref 5–15)
BUN: 13 mg/dL (ref 6–20)
CO2: 22 mmol/L (ref 22–32)
Calcium: 9.1 mg/dL (ref 8.9–10.3)
Chloride: 105 mmol/L (ref 98–111)
Creatinine, Ser: 0.54 mg/dL (ref 0.44–1.00)
GFR calc Af Amer: >60 mL/min (ref >60)
GFR calc non Af Amer: >60 mL/min (ref >60)
Glucose, Bld: 91 mg/dL (ref 70–99)
Potassium: 3.9 mmol/L (ref 3.5–5.1)
Sodium: 138 mmol/L (ref 135–145)

## 2019-06-14 LAB — TROPONIN I (HIGH SENSITIVITY): Troponin I (High Sensitivity): <2 ng/L (ref <18)

## 2019-06-14 NOTE — Telephone Encounter (Signed)
Patient is having chest pain- starting 1-2 hour ago- pain radiating in shoulder and hand. Patient states she has had sweating, feels flush and nausea. Advised patient her symptoms are 911 call- she states she is across the road from hospital and she will have someone take her.  Reason for Disposition . [1] Chest pain lasts > 5 minutes AND [2] described as crushing, pressure-like, or heavy  Answer Assessment - Initial Assessment Questions 1. LOCATION: "Where does it hurt?"       hovering -pressure pain upper left side 2. RADIATION: "Does the pain go anywhere else?" (e.g., into neck, jaw, arms, back)     Shoulder to base of neck into middle finger 3. ONSET: "When did the chest pain begin?" (Minutes, hours or days)      2 hour ago 4. PATTERN "Does the pain come and go, or has it been constant since it started?"  "Does it get worse with exertion?"      Constant- pressure 5. DURATION: "How long does it last" (e.g., seconds, minutes, hours)     hours 6. SEVERITY: "How bad is the pain?"  (e.g., Scale 1-10; mild, moderate, or severe)    - MILD (1-3): doesn't interfere with normal activities     - MODERATE (4-7): interferes with normal activities or awakens from sleep    - SEVERE (8-10): excruciating pain, unable to do any normal activities       3 7. CARDIAC RISK FACTORS: "Do you have any history of heart problems or risk factors for heart disease?" (e.g., angina, prior heart attack; diabetes, high blood pressure, high cholesterol, smoker, or strong family history of heart disease)     No- history of panic attack- this is not it 8. PULMONARY RISK FACTORS: "Do you have any history of lung disease?"  (e.g., blood clots in lung, asthma, emphysema, birth control pills)     no 9. CAUSE: "What do you think is causing the chest pain?"     unsure 10. OTHER SYMPTOMS: "Do you have any other symptoms?" (e.g., dizziness, nausea, vomiting, sweating, fever, difficulty breathing, cough)       Sweating,nausea,  flushed 11. PREGNANCY: "Is there any chance you are pregnant?" "When was your last menstrual period?"       No- LMP- 1 week ago  Protocols used: CHEST PAIN-A-AH

## 2019-06-14 NOTE — ED Triage Notes (Addendum)
Pt c/o CP, SOB x 2 hours-pt denies fever/flu like sx-NAD-steady gait

## 2019-06-14 NOTE — Telephone Encounter (Signed)
FYI

## 2019-06-14 NOTE — Discharge Instructions (Signed)
Please follow-up with your PCP and/or cardiologist for ongoing evaluation management of your chest pain and to further evaluate your abnormal EKG.  Please return to the ED or seek medical attention should develop any new or worsening symptoms.
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
## 2019-06-24 ENCOUNTER — Ambulatory Visit (INDEPENDENT_AMBULATORY_CARE_PROVIDER_SITE_OTHER): Payer: Commercial Managed Care - PPO | Admitting: Psychology

## 2019-06-24 DIAGNOSIS — F331 Major depressive disorder, recurrent, moderate: Secondary | ICD-10-CM

## 2019-07-20 ENCOUNTER — Ambulatory Visit (INDEPENDENT_AMBULATORY_CARE_PROVIDER_SITE_OTHER): Payer: Commercial Managed Care - PPO | Admitting: Psychology

## 2019-07-20 DIAGNOSIS — F331 Major depressive disorder, recurrent, moderate: Secondary | ICD-10-CM | POA: Diagnosis not present

## 2019-08-16 ENCOUNTER — Ambulatory Visit (INDEPENDENT_AMBULATORY_CARE_PROVIDER_SITE_OTHER): Payer: Commercial Managed Care - PPO | Admitting: Psychology

## 2019-08-16 DIAGNOSIS — F331 Major depressive disorder, recurrent, moderate: Secondary | ICD-10-CM

## 2019-09-03 ENCOUNTER — Other Ambulatory Visit: Payer: Self-pay

## 2019-09-03 ENCOUNTER — Other Ambulatory Visit: Payer: Commercial Managed Care - PPO

## 2019-09-03 ENCOUNTER — Encounter: Payer: Self-pay | Admitting: Nurse Practitioner

## 2019-09-03 ENCOUNTER — Telehealth (INDEPENDENT_AMBULATORY_CARE_PROVIDER_SITE_OTHER): Payer: Commercial Managed Care - PPO | Admitting: Nurse Practitioner

## 2019-09-03 VITALS — Temp 98.3°F | Ht 67.0 in | Wt 145.6 lb

## 2019-09-03 DIAGNOSIS — R109 Unspecified abdominal pain: Secondary | ICD-10-CM | POA: Diagnosis not present

## 2019-09-03 LAB — CBC WITH DIFFERENTIAL/PLATELET
Basophils Absolute: 0 10*3/uL (ref 0.0–0.1)
Basophils Relative: 0.5 % (ref 0.0–3.0)
Eosinophils Absolute: 0.6 10*3/uL (ref 0.0–0.7)
Eosinophils Relative: 6.7 % — ABNORMAL HIGH (ref 0.0–5.0)
HCT: 42.5 % (ref 36.0–46.0)
Hemoglobin: 14.2 g/dL (ref 12.0–15.0)
Lymphocytes Relative: 26.6 % (ref 12.0–46.0)
Lymphs Abs: 2.3 10*3/uL (ref 0.7–4.0)
MCHC: 33.4 g/dL (ref 30.0–36.0)
MCV: 93.1 fl (ref 78.0–100.0)
Monocytes Absolute: 0.5 10*3/uL (ref 0.1–1.0)
Monocytes Relative: 5.2 % (ref 3.0–12.0)
Neutro Abs: 5.4 10*3/uL (ref 1.4–7.7)
Neutrophils Relative %: 61 % (ref 43.0–77.0)
Platelets: 276 10*3/uL (ref 150.0–400.0)
RBC: 4.57 Mil/uL (ref 3.87–5.11)
RDW: 12.3 % (ref 11.5–15.5)
WBC: 8.8 10*3/uL (ref 4.0–10.5)

## 2019-09-03 LAB — POCT URINALYSIS DIPSTICK
Bilirubin, UA: NEGATIVE
Blood, UA: NEGATIVE
Glucose, UA: NEGATIVE
Ketones, UA: NEGATIVE
Leukocytes, UA: NEGATIVE
Nitrite, UA: NEGATIVE
Protein, UA: NEGATIVE
Spec Grav, UA: 1.01 (ref 1.010–1.025)
Urobilinogen, UA: 0.2 E.U./dL
pH, UA: 6 (ref 5.0–8.0)

## 2019-09-03 LAB — POCT URINE PREGNANCY: Preg Test, Ur: NEGATIVE

## 2019-09-03 LAB — BASIC METABOLIC PANEL
BUN: 12 mg/dL (ref 6–23)
CO2: 29 mEq/L (ref 19–32)
Calcium: 9.7 mg/dL (ref 8.4–10.5)
Chloride: 102 mEq/L (ref 96–112)
Creatinine, Ser: 0.65 mg/dL (ref 0.40–1.20)
GFR: 107.18 mL/min (ref >60.00)
Glucose, Bld: 88 mg/dL (ref 70–99)
Potassium: 4 mEq/L (ref 3.5–5.1)
Sodium: 138 mEq/L (ref 135–145)

## 2019-09-03 LAB — C-REACTIVE PROTEIN: CRP: <1 mg/dL (ref 0.5–20.0)

## 2019-09-03 MED ORDER — CYCLOBENZAPRINE HCL 7.5 MG PO TABS: 7.5000 mg | ORAL_TABLET | Freq: Every day | ORAL | 0 refills | Status: DC

## 2019-09-03 MED ORDER — NAPROXEN 500 MG PO TABS: 500.0000 mg | ORAL_TABLET | Freq: Two times a day (BID) | ORAL | 0 refills | Status: DC

## 2019-09-03 NOTE — Progress Notes (Signed)
Virtual Visit via Video Note  I connected with@ on 09/03/19 at  8:45 AM EST by a video enabled telemedicine application and verified that I am speaking with the correct person using two identifiers.  Location: Patient:Home Provider: Office Participants: patient and provider  I discussed the limitations of evaluation and management by telemedicine and the availability of in person appointments. I also discussed with the patient that there may be a patient responsible charge related to this service. The patient expressed understanding and agreed to proceed.  [id number] flank and pelvic pain x1week  History of Present Illness: Flank Pain This is a new problem. The current episode started in the past 7 days. The problem occurs constantly. The problem has been gradually worsening since onset. Pain location: flank and pelvic region. The quality of the pain is described as aching and cramping. Radiates to: right pelvic region. The pain is moderate. The symptoms are aggravated by bending. Associated symptoms include abdominal pain and pelvic pain. Pertinent negatives include no bladder incontinence, bowel incontinence, chest pain, dysuria, fever, headaches, paresis, paresthesias, perianal numbness, tingling or weakness. She has tried nothing for the symptoms.  LMP 08/15/2019, has not been sexually active for 2weeks. Denies any vaginal symptoms. No hematuria, but urine is cloudy.   Observations/Objective: Physical Exam  Constitutional: She is oriented to person, place, and time. No distress.  Pulmonary/Chest: Effort normal.  Abdominal: Soft. There is no abdominal tenderness.  Neurological: She is oriented to person, place, and time.  Skin: Skin is warm and dry. No rash noted.  Vitals reviewed.  Assessment and Plan: Chellsie was seen today for flank pain.  Diagnoses and all orders for this visit:  Acute right flank pain -     POCT urinalysis dipstick -     Urine Culture -     CBC w/Diff -      Basic metabolic panel -     POCT urine pregnancy -     C-reactive protein -     naproxen (NAPROSYN) 500 MG tablet; Take 1 tablet (500 mg total) by mouth 2 (two) times daily with a meal. -     cyclobenzaprine (FEXMID) 7.5 MG tablet; Take 1 tablet (7.5 mg total) by mouth at bedtime.   Follow Up Instructions: See avs   I discussed the assessment and treatment plan with the patient. The patient was provided an opportunity to ask questions and all were answered. The patient agreed with the plan and demonstrated an understanding of the instructions.   The patient was advised to call back or seek an in-person evaluation if the symptoms worsen or if the condition fails to improve as anticipated.   Wilfred Lacy, NP

## 2019-09-03 NOTE — Patient Instructions (Addendum)
Normal lab results. Sent naproxen BID with food and muscle relaxant at hs. Schedule face to face appt if no improvement in 3-7days. Go to ED if symptoms worsen.

## 2019-09-04 LAB — URINE CULTURE
MICRO NUMBER:: 10096428
Result:: NO GROWTH
SPECIMEN QUALITY:: ADEQUATE

## 2019-09-07 ENCOUNTER — Ambulatory Visit (INDEPENDENT_AMBULATORY_CARE_PROVIDER_SITE_OTHER): Payer: Commercial Managed Care - PPO | Admitting: Psychology

## 2019-09-07 DIAGNOSIS — F331 Major depressive disorder, recurrent, moderate: Secondary | ICD-10-CM

## 2019-09-21 ENCOUNTER — Ambulatory Visit: Payer: Commercial Managed Care - PPO | Admitting: Psychology

## 2019-10-12 ENCOUNTER — Ambulatory Visit (INDEPENDENT_AMBULATORY_CARE_PROVIDER_SITE_OTHER): Payer: Commercial Managed Care - PPO | Admitting: Psychology

## 2019-10-12 DIAGNOSIS — F331 Major depressive disorder, recurrent, moderate: Secondary | ICD-10-CM

## 2019-11-17 ENCOUNTER — Ambulatory Visit: Payer: Commercial Managed Care - PPO | Admitting: Psychology

## 2019-12-29 ENCOUNTER — Ambulatory Visit (INDEPENDENT_AMBULATORY_CARE_PROVIDER_SITE_OTHER): Payer: Commercial Managed Care - PPO | Admitting: Psychology

## 2019-12-29 DIAGNOSIS — F331 Major depressive disorder, recurrent, moderate: Secondary | ICD-10-CM | POA: Diagnosis not present

## 2020-02-02 ENCOUNTER — Ambulatory Visit (INDEPENDENT_AMBULATORY_CARE_PROVIDER_SITE_OTHER): Payer: Commercial Managed Care - PPO | Admitting: Psychology

## 2020-02-02 DIAGNOSIS — F331 Major depressive disorder, recurrent, moderate: Secondary | ICD-10-CM | POA: Diagnosis not present

## 2020-03-28 ENCOUNTER — Ambulatory Visit (INDEPENDENT_AMBULATORY_CARE_PROVIDER_SITE_OTHER): Payer: Commercial Managed Care - PPO | Admitting: Psychology

## 2020-03-28 DIAGNOSIS — F331 Major depressive disorder, recurrent, moderate: Secondary | ICD-10-CM

## 2020-04-19 ENCOUNTER — Ambulatory Visit (INDEPENDENT_AMBULATORY_CARE_PROVIDER_SITE_OTHER): Payer: Commercial Managed Care - PPO | Admitting: Psychology

## 2020-04-19 DIAGNOSIS — F331 Major depressive disorder, recurrent, moderate: Secondary | ICD-10-CM

## 2020-05-16 ENCOUNTER — Ambulatory Visit (INDEPENDENT_AMBULATORY_CARE_PROVIDER_SITE_OTHER): Payer: Commercial Managed Care - PPO | Admitting: Psychology

## 2020-05-16 DIAGNOSIS — F331 Major depressive disorder, recurrent, moderate: Secondary | ICD-10-CM

## 2020-05-22 ENCOUNTER — Telehealth: Payer: Self-pay | Admitting: Nurse Practitioner

## 2020-05-22 DIAGNOSIS — F332 Major depressive disorder, recurrent severe without psychotic features: Secondary | ICD-10-CM

## 2020-05-22 NOTE — Telephone Encounter (Signed)
Caller Name: Sybel Call back phone #: [phone]  [place] Has patient seen PCP for this complaint? Yes.   - - IF NO, is insurance requiring patient see PCP for this issue before PCP can refer them? Pt states she has talked to [facility] in the past about psychiatry issues Referral for which specialty: Psychiatry/Med Mgmt Zoloft & Ativan Preferred provider/office: Currently with [facility] and is not happy with the care provided - requesting referral elsewhere Reason for referral: med mgmt

## 2020-05-22 NOTE — Telephone Encounter (Signed)
Please advise 

## 2020-05-23 NOTE — Telephone Encounter (Signed)
Ok to enter referral to [facility]. Please advise her that it may take >2weeks to get an appt. If she is needing medication refill at this time, she should maintain her appts with current provider till she can be seen by new provider.

## 2020-05-23 NOTE — Telephone Encounter (Signed)
LVM for patient to return call. 

## 2020-05-24 NOTE — Telephone Encounter (Signed)
Voicemail left informing patient referral has been sent and instructing her to call back for more inoformation.

## 2020-06-13 ENCOUNTER — Ambulatory Visit: Payer: Commercial Managed Care - PPO | Admitting: Psychology

## 2020-07-11 ENCOUNTER — Ambulatory Visit (INDEPENDENT_AMBULATORY_CARE_PROVIDER_SITE_OTHER): Payer: Self-pay | Admitting: Psychology

## 2020-07-11 DIAGNOSIS — F331 Major depressive disorder, recurrent, moderate: Secondary | ICD-10-CM

## 2020-07-25 ENCOUNTER — Encounter: Payer: Self-pay | Admitting: Family

## 2020-07-25 ENCOUNTER — Ambulatory Visit (INDEPENDENT_AMBULATORY_CARE_PROVIDER_SITE_OTHER): Payer: Self-pay | Admitting: Family

## 2020-07-25 ENCOUNTER — Other Ambulatory Visit: Payer: Self-pay

## 2020-07-25 VITALS — BP 116/70 | HR 82 | Temp 98.1°F | Ht 68.0 in | Wt 153.4 lb

## 2020-07-25 DIAGNOSIS — K047 Periapical abscess without sinus: Secondary | ICD-10-CM

## 2020-07-25 MED ORDER — PENICILLIN V POTASSIUM 500 MG PO TABS: 500.0000 mg | ORAL_TABLET | Freq: Four times a day (QID) | ORAL | 0 refills | Status: DC

## 2020-07-25 NOTE — Patient Instructions (Signed)
Dental Abscess  A dental abscess is an area of pus in or around a tooth. It comes from an infection. It can cause pain and other symptoms. Treatment will help with symptoms and prevent the infection from spreading. Follow these instructions at home: Medicines  Take over-the-counter and prescription medicines only as told by your dentist.  If you were prescribed an antibiotic medicine, take it as told by your dentist. Do not stop taking it even if you start to feel better.  If you were prescribed a gel that has numbing medicine in it, use it exactly as told.  Do not drive or use heavy machinery (like a lawn mower) while taking prescription pain medicine. General instructions  Rinse out your mouth often with salt water. ? To make salt water, dissolve -1 tsp of salt in 1 cup of warm water.  Eat a soft diet while your mouth is healing.  Drink enough fluid to keep your urine pale yellow.  Do not apply heat to the outside of your mouth.  Do not use any products that contain nicotine or tobacco. These include cigarettes and e-cigarettes. If you need help quitting, ask your doctor.  Keep all follow-up visits as told by your dentist. This is important. Prevent an abscess  Brush your teeth every morning and every night. Use fluoride toothpaste.  Floss your teeth each day.  Get dental cleanings as often as told by your dentist.  Think about getting dental sealant put on teeth that have deep holes (decay).  Drink water that has fluoride in it. ? Most tap water has fluoride. ? Check the label on bottled water to see if it has fluoride in it.  Drink water instead of sugary drinks.  Eat healthy meals and snacks.  Wear a mouth guard or face shield when you play sports. Contact a doctor if:  Your pain is worse, and medicine does not help. Get help right away if:  You have a fever or chills.  Your symptoms suddenly get worse.  You have a very bad headache.  You have problems  breathing or swallowing.  You have trouble opening your mouth.  You have swelling in your neck or close to your eye. Summary  A dental abscess is an area of pus in or around a tooth. It is caused by an infection.  Treatment will help with symptoms and prevent the infection from spreading.  Take over-the-counter and prescription medicines only as told by your dentist.  To prevent an abscess, take good care of your teeth. Brush your teeth every morning and night. Use floss every day.  Get dental cleanings as often as told by your dentist. This information is not intended to replace advice given to you by your health care provider. Make sure you discuss any questions you have with your health care provider. Document Revised: 11/11/2018 Document Reviewed: 03/24/2017 Elsevier Patient Education  2020 Elsevier Inc.  

## 2020-07-25 NOTE — Progress Notes (Signed)
Acute Office Visit  Subjective:    Patient ID: Tracy Munoz, female    DOB: [birth year], 29 y.o.   MRN: [id number]  Chief Complaint  Patient presents with  . Acute Visit    Pt c/o abscess tooth x 48 hours. Pt could not be seen by dentist and instructed to contact her PCP. Declines flu vaccine.     HPI Patient is in today for a tooth abscess to the bottom left side of her mouth. She contacted her dentist and now has an appointment for January 3rd. They instructed her to see her PCP for medication. She is aware that she need the tooth extracted but reports having a panic attack the last time she was there anticipating the Lidocaine injection.  Past Medical History:  Diagnosis Date  . Major depressive disorder   . Panic attack   . Post traumatic stress disorder   . PTSD (post-traumatic stress disorder)     History reviewed. No pertinent surgical history.  Family History  Problem Relation Age of Onset  . Osteoporosis Mother   . Atrial fibrillation Father   . Heart attack Maternal Grandfather   . Heart attack Paternal Grandfather     Social History   Socioeconomic History  . Marital status: Single    Spouse name: Not on file  . Number of children: Not on file  . Years of education: Not on file  . Highest education level: Not on file  Occupational History  . Not on file  Tobacco Use  . Smoking status: Never Smoker  . Smokeless tobacco: Never Used  Vaping Use  . Vaping Use: Never used  Substance and Sexual Activity  . Alcohol use: Yes    Comment: occ  . Drug use: Yes    Types: Marijuana  . Sexual activity: Not on file  Other Topics Concern  . Not on file  Social History Narrative  . Not on file   Social Determinants of Health   Financial Resource Strain: Not on file  Food Insecurity: Not on file  Transportation Needs: Not on file  Physical Activity: Not on file  Stress: Not on file  Social Connections: Not on file  Intimate Partner Violence: Not on  file    Outpatient Medications Prior to Visit  Medication Sig Dispense Refill  . sertraline (ZOLOFT) 50 MG tablet Take 50 mg by mouth daily.    . cyclobenzaprine (FEXMID) 7.5 MG tablet Take 1 tablet (7.5 mg total) by mouth at bedtime. (Patient not taking: Reported on 07/25/2020) 7 tablet 0  . LORazepam (ATIVAN) 0.5 MG tablet Take 0.5 mg by mouth daily as needed. (Patient not taking: Reported on 07/25/2020)    . naproxen (NAPROSYN) 500 MG tablet Take 1 tablet (500 mg total) by mouth 2 (two) times daily with a meal. (Patient not taking: Reported on 07/25/2020) 14 tablet 0   No facility-administered medications prior to visit.    No Known Allergies  Review of Systems  Constitutional: Negative.   HENT: Positive for dental problem.   Respiratory: Negative.   Cardiovascular: Negative.   Endocrine: Negative.   Musculoskeletal: Negative.   Skin: Negative.   Allergic/Immunologic: Negative.   Neurological: Negative.   Psychiatric/Behavioral: Negative.   All other systems reviewed and are negative.      Objective:    Physical Exam Constitutional:      Appearance: Normal appearance.  HENT:     Mouth/Throat:     Mouth: Mucous membranes are moist.   Cardiovascular:  Rate and Rhythm: Normal rate and regular rhythm.     Pulses: Normal pulses.     Heart sounds: Normal heart sounds.  Pulmonary:     Effort: Pulmonary effort is normal.     Breath sounds: Normal breath sounds.  Musculoskeletal:        General: Normal range of motion.     Cervical back: Normal range of motion and neck supple.  Skin:    General: Skin is dry.  Neurological:     Mental Status: She is alert.  Psychiatric:        Mood and Affect: Mood normal.        Behavior: Behavior normal.     BP 116/70 (BP Location: Left Arm, Patient Position: Sitting, Cuff Size: Normal)   Pulse 82   Temp 98.1 F (36.7 C) (Temporal)   Ht 5\' 8"  (1.727 m)   Wt 153 lb 6.4 oz (69.6 kg)   SpO2 98%   BMI 23.32 kg/m  Wt  Readings from Last 3 Encounters:  07/25/20 153 lb 6.4 oz (69.6 kg)  09/03/19 145 lb 9.6 oz (66 kg)  06/14/19 142 lb (64.4 kg)    Health Maintenance Due  Topic Date Due  . Hepatitis C Screening  Never done  . COVID-19 Vaccine (1) Never done  . HIV Screening  Never done  . PAP SMEAR-Modifier  Never done  . INFLUENZA VACCINE  Never done    There are no preventive care reminders to display for this patient.   Lab Results  Component Value Date   TSH 1.87 02/12/2019   Lab Results  Component Value Date   WBC 8.8 09/03/2019   HGB 14.2 09/03/2019   HCT 42.5 09/03/2019   MCV 93.1 09/03/2019   PLT 276.0 09/03/2019   Lab Results  Component Value Date   NA 138 09/03/2019   K 4.0 09/03/2019   CO2 29 09/03/2019   GLUCOSE 88 09/03/2019   BUN 12 09/03/2019   CREATININE 0.65 09/03/2019   BILITOT 0.6 08/05/2017   ALKPHOS 41 08/05/2017   AST 25 08/05/2017   ALT 24 08/05/2017   PROT 7.6 08/05/2017   ALBUMIN 4.6 08/05/2017   CALCIUM 9.7 09/03/2019   ANIONGAP 11 06/14/2019   GFR 107.18 09/03/2019   No results found for: CHOL No results found for: HDL No results found for: LDLCALC No results found for: TRIG No results found for: CHOLHDL No results found for: HGBA1C     Assessment & Plan:   Problem List Items Addressed This Visit   None   Visit Diagnoses    Dental abscess    -  Primary       Meds ordered this encounter  Medications  . penicillin v potassium (VEETID) 500 MG tablet    Sig: Take 1 tablet (500 mg total) by mouth 4 (four) times daily.    Dispense:  28 tablet    Refill:  0    Follow-up with dentist as scheduled. Call with any concerns.  Kennyth Arnold, FNP

## 2020-08-14 ENCOUNTER — Ambulatory Visit (INDEPENDENT_AMBULATORY_CARE_PROVIDER_SITE_OTHER): Payer: 59 | Admitting: Psychology

## 2020-08-14 DIAGNOSIS — F331 Major depressive disorder, recurrent, moderate: Secondary | ICD-10-CM | POA: Diagnosis not present

## 2020-09-11 ENCOUNTER — Ambulatory Visit: Payer: 59 | Admitting: Psychology

## 2020-10-02 ENCOUNTER — Ambulatory Visit: Payer: 59 | Admitting: Psychology

## 2020-10-11 ENCOUNTER — Other Ambulatory Visit: Payer: Self-pay

## 2020-10-12 ENCOUNTER — Ambulatory Visit: Payer: Managed Care, Other (non HMO) | Admitting: Nurse Practitioner

## 2020-10-12 ENCOUNTER — Encounter: Payer: Self-pay | Admitting: Nurse Practitioner

## 2020-10-12 VITALS — BP 120/84 | HR 86 | Temp 98.3°F | Wt 151.4 lb

## 2020-10-12 DIAGNOSIS — K649 Unspecified hemorrhoids: Secondary | ICD-10-CM | POA: Diagnosis not present

## 2020-10-12 DIAGNOSIS — R197 Diarrhea, unspecified: Secondary | ICD-10-CM

## 2020-10-12 DIAGNOSIS — F332 Major depressive disorder, recurrent severe without psychotic features: Secondary | ICD-10-CM | POA: Diagnosis not present

## 2020-10-12 DIAGNOSIS — R195 Other fecal abnormalities: Secondary | ICD-10-CM | POA: Diagnosis not present

## 2020-10-12 LAB — IFOBT (OCCULT BLOOD): IFOBT: POSITIVE

## 2020-10-12 MED ORDER — HYDROCORTISONE ACETATE 25 MG RE SUPP: 25.0000 mg | Freq: Two times a day (BID) | RECTAL | 0 refills | Status: DC

## 2020-10-12 NOTE — Patient Instructions (Signed)
Go to lab for stool kit.  you will be contacted to schedule appt with GI.  Diarrhea, Adult Diarrhea is when you pass loose and watery poop (stool) often. Diarrhea can make you feel weak and cause you to lose water in your body (get dehydrated). Losing water in your body can cause you to:  Feel tired and thirsty.  Have a dry mouth.  Go pee (urinate) less often. Diarrhea often lasts 2-3 days. However, it can last longer if it is a sign of something more serious. It is important to treat your diarrhea as told by your doctor. Follow these instructions at home: Eating and drinking Follow these instructions as told by your doctor:  Take an ORS (oral rehydration solution). This is a drink that helps you replace fluids and minerals your body lost. It is sold at pharmacies and stores.  Drink plenty of fluids, such as: ? Water. ? Ice chips. ? Diluted fruit juice. ? Low-calorie sports drinks. ? Milk, if you want.  Avoid drinking fluids that have a lot of sugar or caffeine in them.  Eat bland, easy-to-digest foods in small amounts as you are able. These foods include: ? Bananas. ? Applesauce. ? Rice. ? Low-fat (lean) meats. ? Toast. ? Crackers.  Avoid alcohol.  Avoid spicy or fatty foods.      Medicines  Take over-the-counter and prescription medicines only as told by your doctor.  If you were prescribed an antibiotic medicine, take it as told by your doctor. Do not stop using the antibiotic even if you start to feel better. General instructions  Wash your hands often using soap and water. If soap and water are not available, use a hand sanitizer. Others in your home should wash their hands as well. Hands should be washed: ? After using the toilet or changing a diaper. ? Before preparing, cooking, or serving food. ? While caring for a sick person. ? While visiting someone in a hospital.  Drink enough fluid to keep your pee (urine) pale yellow.  Rest at home while you get  better.  Watch your condition for any changes.  Take a warm bath to help with any burning or pain from having diarrhea.  Keep all follow-up visits as told by your doctor. This is important.   Contact a doctor if:  You have a fever.  Your diarrhea gets worse.  You have new symptoms.  You cannot keep fluids down.  You feel light-headed or dizzy.  You have a headache.  You have muscle cramps. Get help right away if:  You have chest pain.  You feel very weak or you pass out (faint).  You have bloody or black poop or poop that looks like tar.  You have very bad pain, cramping, or bloating in your belly (abdomen).  You have trouble breathing or you are breathing very quickly.  Your heart is beating very quickly.  Your skin feels cold and clammy.  You feel confused.  You have signs of losing too much water in your body, such as: ? Dark pee, very little pee, or no pee. ? Cracked lips. ? Dry mouth. ? Sunken eyes. ? Sleepiness. ? Weakness. Summary  Diarrhea is when you pass loose and watery poop (stool) often.  Diarrhea can make you feel weak and cause you to lose water in your body (get dehydrated).  Take an ORS (oral rehydration solution). This is a drink that is sold at pharmacies and stores.  Eat bland, easy-to-digest foods in  small amounts as you are able.  Contact a doctor if your condition gets worse. Get help right away if you have signs that you have lost too much water in your body. This information is not intended to replace advice given to you by your health care provider. Make sure you discuss any questions you have with your health care provider. Document Revised: 12/26/2017 Document Reviewed: 12/26/2017 Elsevier Patient Education  2021 [employer].

## 2020-10-12 NOTE — Progress Notes (Signed)
Subjective:  Patient ID: Tracy Munoz, female    DOB: 1990/10/22  Age: 30 y.o. MRN: [id number]  CC: Acute Visit (Pt c/o bloody stool x 2-3 months but worse in the past month. Hx of hemorrhoids. Pt states she is having 2-3 bowel movements a day and now it is starting to be painful when she uses the restroom and it is uncomfortable sitting.  )  Diarrhea  This is a new problem. The current episode started 1 to 4 weeks ago. The problem occurs 2 to 4 times per day. The problem has been unchanged. The stool consistency is described as watery. The patient states that diarrhea does not awaken her from sleep. Associated symptoms include abdominal pain. Pertinent negatives include no arthralgias, bloating, chills, coughing, fever, headaches, increased  flatus, myalgias, sweats, URI or vomiting. Nothing aggravates the symptoms. There are no known risk factors. She has tried increased fluids and anti-motility drug for the symptoms. The treatment provided no relief. Her past medical history is significant for irritable bowel syndrome.  Blood on tiolet paper but not in stool. Undigested food in stool No oral abx used in last 8months. Wt Readings from Last 3 Encounters:  10/12/20 151 lb 6.4 oz (68.7 kg)  07/25/20 153 lb 6.4 oz (69.6 kg)  09/03/19 145 lb 9.6 oz (66 kg)   MDD (major depressive disorder), recurrent severe, without psychosis ([place]) Stable mood with zoloft Under the care of [place] and psychologist. Last OV 39months ago  Reviewed past Medical, Social and Family history today.  Outpatient Medications Prior to Visit  Medication Sig Dispense Refill  . sertraline (ZOLOFT) 50 MG tablet Take 50 mg by mouth daily.    . cyclobenzaprine (FEXMID) 7.5 MG tablet Take 1 tablet (7.5 mg total) by mouth at bedtime. (Patient not taking: No sig reported) 7 tablet 0  . LORazepam (ATIVAN) 0.5 MG tablet Take 0.5 mg by mouth daily as needed. (Patient not taking: No sig reported)    .  naproxen (NAPROSYN) 500 MG tablet Take 1 tablet (500 mg total) by mouth 2 (two) times daily with a meal. (Patient not taking: No sig reported) 14 tablet 0  . penicillin v potassium (VEETID) 500 MG tablet Take 1 tablet (500 mg total) by mouth 4 (four) times daily. (Patient not taking: Reported on 10/12/2020) 28 tablet 0   No facility-administered medications prior to visit.    ROS See HPI  Objective:  BP 120/84 (BP Location: Left Arm, Patient Position: Sitting, Cuff Size: Normal)   Pulse 86   Temp 98.3 F (36.8 C) (Temporal)   Wt 151 lb 6.4 oz (68.7 kg)   SpO2 98%   BMI 23.02 kg/m   Physical Exam Constitutional:      General: She is not in acute distress. Abdominal:     Palpations: Abdomen is soft.  Genitourinary:    Rectum: Guaiac result positive. No tenderness, anal fissure or external hemorrhoid.  Neurological:     Mental Status: She is alert and oriented to person, place, and time.    Assessment & Plan:  This visit occurred during the SARS-CoV-2 public health emergency.  Safety protocols were in place, including screening questions prior to the visit, additional usage of staff PPE, and extensive cleaning of exam room while observing appropriate contact time as indicated for disinfecting solutions.   Tracy Munoz was seen today for acute visit.  Diagnoses and all orders for this visit:  Diarrhea, unspecified type -     GI Profile, Stool, PCR; Future  MDD (major depressive disorder), recurrent severe, without psychosis ([facility])  Bleeding hemorrhoid -     hydrocortisone (ANUSOL-HC) 25 MG suppository; Place 1 suppository (25 mg total) rectally 2 (two) times daily. -     Ambulatory referral to Gastroenterology -     IFOBT POC (occult bld, rslt in office)  Positive fecal occult blood test -     Ambulatory referral to Gastroenterology -     IFOBT POC (occult bld, rslt in office)    Problem List Items Addressed This Visit      Other   MDD (major depressive disorder),  recurrent severe, without psychosis ([place])    Stable mood with zoloft Under the care of [place] and psychologist. Last OV 41months ago       Other Visit Diagnoses    Diarrhea, unspecified type    -  Primary   Relevant Orders   GI Profile, Stool, PCR   Bleeding hemorrhoid       Relevant Medications   hydrocortisone (ANUSOL-HC) 25 MG suppository   Other Relevant Orders   Ambulatory referral to Gastroenterology   IFOBT POC (occult bld, rslt in office) (Completed)   Positive fecal occult blood test       Relevant Orders   Ambulatory referral to Gastroenterology   IFOBT POC (occult bld, rslt in office) (Completed)      Follow-up: Return in about 3 months (around 01/12/2021) for CPE (fasting).  Wilfred Lacy, NP

## 2020-10-12 NOTE — Assessment & Plan Note (Signed)
Stable mood with zoloft Under the care of [place] and psychologist. Last OV 17months ago

## 2020-10-13 NOTE — Addendum Note (Signed)
Addended by: Lynnea Ferrier on: 10/13/2020 02:09 PM   Modules accepted: Orders

## 2020-10-16 LAB — GI PROFILE, STOOL, PCR

## 2020-10-20 ENCOUNTER — Telehealth: Payer: Self-pay

## 2020-10-20 DIAGNOSIS — R197 Diarrhea, unspecified: Secondary | ICD-10-CM

## 2020-10-20 NOTE — Telephone Encounter (Signed)
Pt calling today to get the number to where her referral was sent for gastro.  I gave pt information for her to call and get scheduled to be seen.  Pt also wanted Baldo Ash to know that she is still having diarrhea and bleeding.   Please see message.  Thank you.

## 2020-10-21 NOTE — Telephone Encounter (Signed)
Did she use hemorrhoid ointment? Also need to call GI office for an appt. How many watery stools per day?

## 2020-10-23 ENCOUNTER — Ambulatory Visit (INDEPENDENT_AMBULATORY_CARE_PROVIDER_SITE_OTHER): Payer: 59 | Admitting: Psychology

## 2020-10-23 DIAGNOSIS — F331 Major depressive disorder, recurrent, moderate: Secondary | ICD-10-CM

## 2020-10-24 ENCOUNTER — Encounter: Payer: Self-pay | Admitting: Gastroenterology

## 2020-10-24 ENCOUNTER — Ambulatory Visit (INDEPENDENT_AMBULATORY_CARE_PROVIDER_SITE_OTHER): Payer: Managed Care, Other (non HMO) | Admitting: Gastroenterology

## 2020-10-24 ENCOUNTER — Other Ambulatory Visit: Payer: Self-pay

## 2020-10-24 VITALS — BP 102/76 | HR 76 | Ht 68.0 in | Wt 152.5 lb

## 2020-10-24 DIAGNOSIS — R194 Change in bowel habit: Secondary | ICD-10-CM | POA: Diagnosis not present

## 2020-10-24 DIAGNOSIS — K6289 Other specified diseases of anus and rectum: Secondary | ICD-10-CM

## 2020-10-24 MED ORDER — AMBULATORY NON FORMULARY MEDICATION: 0 refills | Status: DC

## 2020-10-24 NOTE — Progress Notes (Signed)
Referring Provider: Flossie Buffy, NP Primary Care Physician:  Flossie Buffy, NP  Reason for Consultation:  Change in bowel habits and rectal bleeding   IMPRESSION:  Rectal pain and bleeding with altered bowel habits  The differential for rectal bleeding is broad.  It includes outlet sources such as fissure or hemorrhoids, as well as polyps, mass, ulcers, and colitis given the concurrent change in bowel habits.  No source seen on recent rectal exam with NP Nche. Given this differential I am recommending a colonoscopy. Given the limited response to Preparation H or hydrocortisone suppositories, I am recommending an empiric trial of nitroglycerin 0.125% while we are awaiting colonoscopy.   PLAN: High fiber diet recommended, drink at least 1.5-2 liters of water Daily stool bulking agent with psyllium or methylcellulose recommended Nitroglycerin 0.125% gel applied to the rectum TID x 6-8 weeks (not to be used with concurrent phosphodiesterase inhibitors) Colonoscopy  Please see the "Patient Instructions" section for addition details about the plan.  HPI: Tracy Munoz is a 30 y.o. female referred by NP Nche for further evaluation of rectal bleeding and diarrhea. The history is obtained through the patient and review of her electronic health record.  She has major depressive disorder, anxiety, and PTSH.   One to two month history of rectal pain and bleeding that started after straining with defecation. Over the last month she is having up to 4-5 watery BM daily. Volume of bleeding has increased. Seeing undigested food particles in her stool.   No improvement with preparation H or empiric treatment with hydrocortisone suppositories.  Normal rectal exam with NP Nche.   Normal CBC 2021 GI pathogen panel negative 10/2020  FOBT + 10/2020  Paternal great grandfather with colon cancer at an advanced age. No other known family history of colon cancer or polyps. No family history  of uterine/endometrial cancer, pancreatic cancer or gastric/stomach cancer.   Past Medical History:  Diagnosis Date  . Major depressive disorder   . Panic attack   . Post traumatic stress disorder   . PTSD (post-traumatic stress disorder)     No past surgical history on file.  Current Outpatient Medications  Medication Sig Dispense Refill  . cyclobenzaprine (FEXMID) 7.5 MG tablet Take 1 tablet (7.5 mg total) by mouth at bedtime. (Patient not taking: No sig reported) 7 tablet 0  . hydrocortisone (ANUSOL-HC) 25 MG suppository Place 1 suppository (25 mg total) rectally 2 (two) times daily. 12 suppository 0  . LORazepam (ATIVAN) 0.5 MG tablet Take 0.5 mg by mouth daily as needed. (Patient not taking: No sig reported)    . naproxen (NAPROSYN) 500 MG tablet Take 1 tablet (500 mg total) by mouth 2 (two) times daily with a meal. (Patient not taking: No sig reported) 14 tablet 0  . penicillin v potassium (VEETID) 500 MG tablet Take 1 tablet (500 mg total) by mouth 4 (four) times daily. (Patient not taking: Reported on 10/12/2020) 28 tablet 0  . sertraline (ZOLOFT) 50 MG tablet Take 50 mg by mouth daily.     No current facility-administered medications for this visit.    Allergies as of 10/24/2020  . (No Known Allergies)    Family History  Problem Relation Age of Onset  . Osteoporosis Mother   . Atrial fibrillation Father   . Heart attack Maternal Grandfather   . Heart attack Paternal Grandfather     Social History   Socioeconomic History  . Marital status: Single    Spouse name: Not on  file  . Number of children: Not on file  . Years of education: Not on file  . Highest education level: Not on file  Occupational History  . Not on file  Tobacco Use  . Smoking status: Never Smoker  . Smokeless tobacco: Never Used  Vaping Use  . Vaping Use: Never used  Substance and Sexual Activity  . Alcohol use: Yes    Comment: occ  . Drug use: Yes    Types: Marijuana  . Sexual activity:  Not on file  Other Topics Concern  . Not on file  Social History Narrative  . Not on file   Social Determinants of Health   Financial Resource Strain: Not on file  Food Insecurity: Not on file  Transportation Needs: Not on file  Physical Activity: Not on file  Stress: Not on file  Social Connections: Not on file  Intimate Partner Violence: Not on file    Review of Systems: 12 system ROS is negative except as noted above.   Physical Exam: General:   Alert,  well-nourished, pleasant and cooperative in NAD Head:  Normocephalic and atraumatic. Eyes:  Sclera clear, no icterus.   Conjunctiva pink. Abdomen:  Soft,nontender, nondistended, normal bowel sounds, no rebound or guarding. No hepatosplenomegaly.   Rectal:  Deferred to time of colonoscopy at patient request.  Msk:  Symmetrical. No boney deformities LAD: No inguinal or umbilical LAD Extremities:  No clubbing or edema. Neurologic:  Alert and  oriented x4;  grossly nonfocal Skin:  Intact without significant lesions or rashes. Psych:  Alert and cooperative. Normal mood and affect.     Kimberly L. Tarri Glenn, MD, MPH 10/24/2020, 2:30 PM

## 2020-10-24 NOTE — Patient Instructions (Addendum)
It was a pleasure to meet you today. I recommend: - High fiber diet recommended, drink at least 1.5-2 liters of water - Daily stool bulking agent with psyllium or methylcellulose recommended - Nitroglycerin 0.125% gel applied to the rectum three times daily x 6-8 weeks (not to be used with concurrent phosphodiesterase inhibitors) - I have recommended a colonoscopy for further evaluation.    Tips for colonoscopy:  - Stay well hydrated for 3-4 days prior to the exam. This reduces nausea and dehydration.  - To prevent skin/hemorrhoid irritation - prior to wiping, put A&Dointment or vaseline on the toilet paper. - Keep a towel or pad on the bed.  - Drink  64oz of clear liquids in the morning of prep day (prior to starting the prep) to be sure that there is enough fluid to flush the colon and stay hydrated!!!! This is in addition to the fluids required for preparation. - Use of a flavored hard candy, such as grape Anise Salvo, can counteract some of the flavor of the prep and may prevent some nausea.   Your provider has prescribed Nitroglycerin 0.125% gel applied to the rectum TID x 6-8 weeks (not to be used with concurrent phosphodiesterase inhibitors) Please follow the directions written on your prescription bottle or given to you specifically by your provider. Since this is a specialty medication and is not readily available at most local pharmacies, we have sent your prescription to:  [facility] information is below: Address: [address] [place], [place] [address]  [phone]  *Please DO NOT go directly from our office to pick up this medication! Give the pharmacy 1 day to process the prescription as this is compounded and takes time to make.

## 2020-10-25 MED ORDER — DIPHENOXYLATE-ATROPINE 2.5-0.025 MG PO TABS: 1.0000 | ORAL_TABLET | Freq: Four times a day (QID) | ORAL | 0 refills | Status: DC | PRN

## 2020-10-25 NOTE — Telephone Encounter (Signed)
Patient notified and verbalized understanding. 

## 2020-10-25 NOTE — Telephone Encounter (Signed)
Pt states she does have appt with GI next week, and she is using the ointment which is helping. Pt states she still has not had a solid bowel movement. Pt states she has 3-4 watery stools a day.

## 2020-10-30 ENCOUNTER — Ambulatory Visit: Payer: 59 | Admitting: Psychology

## 2020-11-03 ENCOUNTER — Encounter: Payer: Self-pay | Admitting: Gastroenterology

## 2020-11-03 ENCOUNTER — Ambulatory Visit (AMBULATORY_SURGERY_CENTER): Payer: Managed Care, Other (non HMO) | Admitting: Gastroenterology

## 2020-11-03 ENCOUNTER — Other Ambulatory Visit: Payer: Self-pay

## 2020-11-03 VITALS — BP 97/49 | HR 69 | Temp 96.6°F | Resp 12 | Ht 68.0 in | Wt 152.0 lb

## 2020-11-03 DIAGNOSIS — K6289 Other specified diseases of anus and rectum: Secondary | ICD-10-CM

## 2020-11-03 DIAGNOSIS — R194 Change in bowel habit: Secondary | ICD-10-CM

## 2020-11-03 DIAGNOSIS — K635 Polyp of colon: Secondary | ICD-10-CM

## 2020-11-03 DIAGNOSIS — D122 Benign neoplasm of ascending colon: Secondary | ICD-10-CM

## 2020-11-03 DIAGNOSIS — D125 Benign neoplasm of sigmoid colon: Secondary | ICD-10-CM

## 2020-11-03 MED ORDER — SODIUM CHLORIDE 0.9 % IV SOLN: 500.0000 mL | Freq: Once | INTRAVENOUS | Status: DC

## 2020-11-03 MED ORDER — NIFEDIPINE 10 MG PO CAPS: 10.0000 mg | ORAL_CAPSULE | Freq: Three times a day (TID) | ORAL | Status: AC

## 2020-11-03 NOTE — Patient Instructions (Signed)
Handout given: polyps Resume previous diet Continue current medications Take sitz baths 2-3 times daily Start high fiber diet and Increase water intake YOU HAD AN ENDOSCOPIC PROCEDURE TODAY AT [facility]:   Refer to the procedure report that was given to you for any specific questions about what was found during the examination.  If the procedure report does not answer your questions, please call your gastroenterologist to clarify.  If you requested that your care partner not be given the details of your procedure findings, then the procedure report has been included in a sealed envelope for you to review at your convenience later.  YOU SHOULD EXPECT: Some feelings of bloating in the abdomen. Passage of more gas than usual.  Walking can help get rid of the air that was put into your GI tract during the procedure and reduce the bloating. If you had a lower endoscopy (such as a colonoscopy or flexible sigmoidoscopy) you may notice spotting of blood in your stool or on the toilet paper. If you underwent a bowel prep for your procedure, you may not have a normal bowel movement for a few days.  Please Note:  You might notice some irritation and congestion in your nose or some drainage.  This is from the oxygen used during your procedure.  There is no need for concern and it should clear up in a day or so.  SYMPTOMS TO REPORT IMMEDIATELY:   Following lower endoscopy (colonoscopy or flexible sigmoidoscopy):  Excessive amounts of blood in the stool  Significant tenderness or worsening of abdominal pains  Swelling of the abdomen that is new, acute  Fever of 100F or higher  For urgent or emergent issues, a gastroenterologist can be reached at any hour by calling [phone]. Do not use MyChart messaging for urgent concerns.   DIET:  We do recommend a small meal at first, but then you may proceed to your regular diet.  Drink plenty of fluids but you should avoid alcoholic beverages  for 24 hours.  ACTIVITY:  You should plan to take it easy for the rest of today and you should NOT DRIVE or use heavy machinery until tomorrow (because of the sedation medicines used during the test).    FOLLOW UP: Our staff will call the number listed on your records 48-72 hours following your procedure to check on you and address any questions or concerns that you may have regarding the information given to you following your procedure. If we do not reach you, we will leave a message.  We will attempt to reach you two times.  During this call, we will ask if you have developed any symptoms of COVID 19. If you develop any symptoms (ie: fever, flu-like symptoms, shortness of breath, cough etc.) before then, please call [phone].  If you test positive for Covid 19 in the 2 weeks post procedure, please call and report this information to [place].    If any biopsies were taken you will be contacted by phone or by letter within the next 1-3 weeks.  Please call us at [phone] if you have not heard about the biopsies in 3 weeks.   SIGNATURES/CONFIDENTIALITY: You and/or your care partner have signed paperwork which will be entered into your electronic medical record.  These signatures attest to the fact that that the information above on your After Visit Summary has been reviewed and is understood.  Full responsibility of the confidentiality of this discharge information lies with you and/or  your care-partner. 

## 2020-11-03 NOTE — Progress Notes (Signed)
Called to room to assist during endoscopic procedure.  Patient ID and intended procedure confirmed with present staff. Received instructions for my participation in the procedure from the performing physician.  

## 2020-11-03 NOTE — Op Note (Signed)
[facility] Patient Name: Tracy Munoz Procedure Date: 11/03/2020 8:53 AM MRN: [id number] Endoscopist: Thornton Park MD, MD Age: 30 Referring MD:  Date of Birth: July 09, 1991 Gender: Female Account #: [id number] Procedure:                Colonoscopy Indications:              Rectal bleeding, Change in bowel habits, Rectal pain Medicines:                Monitored Anesthesia Care Procedure:                Pre-Anesthesia Assessment:                           - Prior to the procedure, a History and Physical                            was performed, and patient medications and                            allergies were reviewed. The patient's tolerance of                            previous anesthesia was also reviewed. The risks                            and benefits of the procedure and the sedation                            options and risks were discussed with the patient.                            All questions were answered, and informed consent                            was obtained. Prior Anticoagulants: The patient has                            taken no previous anticoagulant or antiplatelet                            agents. ASA Grade Assessment: II - A patient with                            mild systemic disease. After reviewing the risks                            and benefits, the patient was deemed in                            satisfactory condition to undergo the procedure.                           After obtaining informed consent, the colonoscope  was passed under direct vision. Throughout the                            procedure, the patient's blood pressure, pulse, and                            oxygen saturations were monitored continuously. The                            Olympus CF-HQ190 (#[id number]) Colonoscope was                            introduced through the anus and advanced to the 3                            cm  into the ileum. The colonoscopy was performed                            without difficulty. The patient tolerated the                            procedure well. The quality of the bowel                            preparation was good. The terminal ileum, ileocecal                            valve, appendiceal orifice, and rectum were                            photographed. Scope In: 9:09:40 AM Scope Out: 9:27:32 AM Scope Withdrawal Time: 0 hours 13 minutes 56 seconds  Total Procedure Duration: 0 hours 17 minutes 52 seconds  Findings:                 A posterior anal fissure was found on perianal exam.                           A 2 mm polyp was found in the sigmoid colon. The                            polyp was sessile. The polyp was removed with a                            cold snare. Resection and retrieval were complete.                            Estimated blood loss was minimal.                           A 2 mm polyp was found in the ascending colon. The                            polyp was sessile.  The polyp was removed with a                            cold snare. Resection and retrieval were complete.                            Estimated blood loss was minimal.                           The exam was otherwise without abnormality on                            direct and retroflexion views. Complications:            No immediate complications. Estimated blood loss:                            Minimal. Estimated Blood Loss:     Estimated blood loss was minimal. Impression:               - Posterior anal fissure found on perianal exam.                           - One 2 mm polyp in the sigmoid colon, removed with                            a cold snare. Resected and retrieved.                           - One 2 mm polyp in the ascending colon, removed                            with a cold snare. Resected and retrieved.                           - The examination was otherwise normal  on direct                            and retroflexion views. Recommendation:           - Patient has a contact number available for                            emergencies. The signs and symptoms of potential                            delayed complications were discussed with the                            patient. Return to normal activities tomorrow.                            Written discharge instructions were provided to the  patient.                           - Resume previous diet.                           - Continue present medications.                           - Sitz baths 2-3 daily for pain relief                           - High fiber diet - including both dietary fiber                            (35 grams daily) and water intake                           - Use a daily dose of Metamucil or Citrucel                           - Nifedipine with 5% lidocaine applied to the anal                            opening 2-3 times a daily for 6-8 weeks                           - Await pathology results.                           - Repeat colonoscopy date to be determined after                            pending pathology results are reviewed for                            surveillance.                           - Emerging evidence supports eating a diet of                            fruits, vegetables, grains, calcium, and yogurt                            while reducing red meat and alcohol may reduce the                            risk of colon cancer.itz baths two to three times                            daily for pain relief                           - Follow-up in the office in 4-6 weeks to monitor  response to treatment. Thornton Park MD, MD 11/03/2020 9:37:04 AM This report has been signed electronically.

## 2020-11-03 NOTE — Progress Notes (Signed)
Medical history reviewed with no changes noted. VS assessed by C.W 

## 2020-11-03 NOTE — Progress Notes (Signed)
PT taken to PACU. Monitors in place. VSS. Report given to RN. 

## 2020-11-07 ENCOUNTER — Telehealth: Payer: Self-pay

## 2020-11-07 NOTE — Telephone Encounter (Signed)
  Follow up Call-  Call back number 11/03/2020  Post procedure Call Back phone  # [phone]  Permission to leave phone message Yes  Some recent data might be hidden     Patient questions:  Do you have a fever, pain , or abdominal swelling? No. Pain Score  0 *  Have you tolerated food without any problems? Yes.    Have you been able to return to your normal activities? Yes.    Do you have any questions about your discharge instructions: Diet   No. Medications  No. Follow up visit  No.  Do you have questions or concerns about your Care? No.  Actions: * If pain score is 4 or above: No action needed, pain <4.

## 2020-11-14 ENCOUNTER — Encounter: Payer: Self-pay | Admitting: Gastroenterology

## 2020-11-29 ENCOUNTER — Ambulatory Visit (INDEPENDENT_AMBULATORY_CARE_PROVIDER_SITE_OTHER): Payer: 59 | Admitting: Psychology

## 2020-11-29 DIAGNOSIS — F331 Major depressive disorder, recurrent, moderate: Secondary | ICD-10-CM

## 2020-12-13 ENCOUNTER — Ambulatory Visit: Payer: Managed Care, Other (non HMO) | Admitting: Gastroenterology

## 2020-12-25 ENCOUNTER — Ambulatory Visit (INDEPENDENT_AMBULATORY_CARE_PROVIDER_SITE_OTHER): Payer: 59 | Admitting: Psychology

## 2020-12-25 DIAGNOSIS — F331 Major depressive disorder, recurrent, moderate: Secondary | ICD-10-CM | POA: Diagnosis not present

## 2021-01-10 ENCOUNTER — Ambulatory Visit (INDEPENDENT_AMBULATORY_CARE_PROVIDER_SITE_OTHER): Payer: 59 | Admitting: Psychology

## 2021-01-10 DIAGNOSIS — F331 Major depressive disorder, recurrent, moderate: Secondary | ICD-10-CM | POA: Diagnosis not present

## 2021-01-12 ENCOUNTER — Other Ambulatory Visit: Payer: Self-pay

## 2021-01-12 ENCOUNTER — Encounter: Payer: Self-pay | Admitting: Nurse Practitioner

## 2021-01-12 ENCOUNTER — Ambulatory Visit (INDEPENDENT_AMBULATORY_CARE_PROVIDER_SITE_OTHER): Payer: Managed Care, Other (non HMO) | Admitting: Nurse Practitioner

## 2021-01-12 VITALS — BP 114/68 | HR 80 | Temp 97.2°F | Ht 68.0 in | Wt 152.2 lb

## 2021-01-12 DIAGNOSIS — Z136 Encounter for screening for cardiovascular disorders: Secondary | ICD-10-CM | POA: Diagnosis not present

## 2021-01-12 DIAGNOSIS — F332 Major depressive disorder, recurrent severe without psychotic features: Secondary | ICD-10-CM

## 2021-01-12 DIAGNOSIS — Z13220 Encounter for screening for lipoid disorders: Secondary | ICD-10-CM

## 2021-01-12 DIAGNOSIS — Z0000 Encounter for general adult medical examination without abnormal findings: Secondary | ICD-10-CM | POA: Diagnosis not present

## 2021-01-12 LAB — CBC WITH DIFFERENTIAL/PLATELET
Basophils Absolute: 0 10*3/uL (ref 0.0–0.1)
Basophils Relative: 0.7 % (ref 0.0–3.0)
Eosinophils Absolute: 0.4 10*3/uL (ref 0.0–0.7)
Eosinophils Relative: 6.7 % — ABNORMAL HIGH (ref 0.0–5.0)
HCT: 39 % (ref 36.0–46.0)
Hemoglobin: 13.4 g/dL (ref 12.0–15.0)
Lymphocytes Relative: 33.8 % (ref 12.0–46.0)
Lymphs Abs: 2.1 10*3/uL (ref 0.7–4.0)
MCHC: 34.3 g/dL (ref 30.0–36.0)
MCV: 92.9 fl (ref 78.0–100.0)
Monocytes Absolute: 0.5 10*3/uL (ref 0.1–1.0)
Monocytes Relative: 7.4 % (ref 3.0–12.0)
Neutro Abs: 3.2 10*3/uL (ref 1.4–7.7)
Neutrophils Relative %: 51.4 % (ref 43.0–77.0)
Platelets: 252 10*3/uL (ref 150.0–400.0)
RBC: 4.2 Mil/uL (ref 3.87–5.11)
RDW: 12.6 % (ref 11.5–15.5)
WBC: 6.1 10*3/uL (ref 4.0–10.5)

## 2021-01-12 LAB — COMPREHENSIVE METABOLIC PANEL
ALT: 17 U/L (ref 0–35)
AST: 16 U/L (ref 0–37)
Albumin: 4.6 g/dL (ref 3.5–5.2)
Alkaline Phosphatase: 39 U/L (ref 39–117)
BUN: 14 mg/dL (ref 6–23)
CO2: 27 mEq/L (ref 19–32)
Calcium: 9.4 mg/dL (ref 8.4–10.5)
Chloride: 105 mEq/L (ref 96–112)
Creatinine, Ser: 0.63 mg/dL (ref 0.40–1.20)
GFR: 118.43 mL/min (ref >60.00)
Glucose, Bld: 86 mg/dL (ref 70–99)
Potassium: 4.1 mEq/L (ref 3.5–5.1)
Sodium: 140 mEq/L (ref 135–145)
Total Bilirubin: 0.7 mg/dL (ref 0.2–1.2)
Total Protein: 6.9 g/dL (ref 6.0–8.3)

## 2021-01-12 LAB — LIPID PANEL
Cholesterol: 187 mg/dL (ref 0–200)
HDL: 61.6 mg/dL (ref >39.00)
LDL Cholesterol: 111 mg/dL — ABNORMAL HIGH (ref 0–99)
NonHDL: 125.3
Total CHOL/HDL Ratio: 3
Triglycerides: 72 mg/dL (ref 0.0–149.0)
VLDL: 14.4 mg/dL (ref 0.0–40.0)

## 2021-01-12 LAB — TSH: TSH: 0.99 u[IU]/mL (ref 0.35–4.50)

## 2021-01-12 NOTE — Assessment & Plan Note (Signed)
Managed by Rosana Berger Current use of zoloft
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
## 2021-01-12 NOTE — Patient Instructions (Signed)
Go to lab for blood draw  Preventive Care 21-31 Years Old, Female Preventive care refers to lifestyle choices and visits with your health care provider that can promote health and wellness. This includes: A yearly physical exam. This is also called an annual wellness visit. Regular dental and eye exams. Immunizations. Screening for certain conditions. Healthy lifestyle choices, such as: Eating a healthy diet. Getting regular exercise. Not using drugs or products that contain nicotine and tobacco. Limiting alcohol use. What can I expect for my preventive care visit? Physical exam Your health care provider may check your: Height and weight. These may be used to calculate your BMI (body mass index). BMI is a measurement that tells if you are at a healthy weight. Heart rate and blood pressure. Body temperature. Skin for abnormal spots. Counseling Your health care provider may ask you questions about your: Past medical problems. Family's medical history. Alcohol, tobacco, and drug use. Emotional well-being. Home life and relationship well-being. Sexual activity. Diet, exercise, and sleep habits. Work and work environment. Access to firearms. Method of birth control. Menstrual cycle. Pregnancy history. What immunizations do I need?  Vaccines are usually given at various ages, according to a schedule. Your health care provider will recommend vaccines for you based on your age, medicalhistory, and lifestyle or other factors, such as travel or where you work. What tests do I need?  Blood tests Lipid and cholesterol levels. These may be checked every 5 years starting at age 20. Hepatitis C test. Hepatitis B test. Screening Diabetes screening. This is done by checking your blood sugar (glucose) after you have not eaten for a while (fasting). STD (sexually transmitted disease) testing, if you are at risk. BRCA-related cancer screening. This may be done if you have a family history  of breast, ovarian, tubal, or peritoneal cancers. Pelvic exam and Pap test. This may be done every 3 years starting at age 21. Starting at age 30, this may be done every 5 years if you have a Pap test in combination with an HPV test. Talk with your health care provider about your test results, treatment options,and if necessary, the need for more tests. Follow these instructions at home: Eating and drinking  Eat a healthy diet that includes fresh fruits and vegetables, whole grains, lean protein, and low-fat dairy products. Take vitamin and mineral supplements as recommended by your health care provider. Do not drink alcohol if: Your health care provider tells you not to drink. You are pregnant, may be pregnant, or are planning to become pregnant. If you drink alcohol: Limit how much you have to 0-1 drink a day. Be aware of how much alcohol is in your drink. In the [place] one drink equals one 12 oz bottle of beer (355 mL), one 5 oz glass of wine (148 mL), or one 1 oz glass of hard liquor (44 mL).  Lifestyle Take daily care of your teeth and gums. Brush your teeth every morning and night with fluoride toothpaste. Floss one time each day. Stay active. Exercise for at least 30 minutes 5 or more days each week. Do not use any products that contain nicotine or tobacco, such as cigarettes, e-cigarettes, and chewing tobacco. If you need help quitting, ask your health care provider. Do not use drugs. If you are sexually active, practice safe sex. Use a condom or other form of protection to prevent STIs (sexually transmitted infections). If you do not wish to become pregnant, use a form of birth control. If you plan   to become pregnant, see your health care provider for a prepregnancy visit. Find healthy ways to cope with stress, such as: Meditation, yoga, or listening to music. Journaling. Talking to a trusted person. Spending time with friends and family. Safety Always wear your seat belt while  driving or riding in a vehicle. Do not drive: If you have been drinking alcohol. Do not ride with someone who has been drinking. When you are tired or distracted. While texting. Wear a helmet and other protective equipment during sports activities. If you have firearms in your house, make sure you follow all gun safety procedures. Seek help if you have been physically or sexually abused. What's next? Go to your health care provider once a year for an annual wellness visit. Ask your health care provider how often you should have your eyes and teeth checked. Stay up to date on all vaccines. This information is not intended to replace advice given to you by your health care provider. Make sure you discuss any questions you have with your healthcare provider. Document Revised: 03/19/2020 Document Reviewed: 04/02/2018 Elsevier Patient Education  2022 [employer].

## 2021-01-13 ENCOUNTER — Encounter: Payer: Self-pay | Admitting: Nurse Practitioner

## 2021-01-31 ENCOUNTER — Ambulatory Visit: Payer: 59 | Admitting: Psychology

## 2021-01-31 ENCOUNTER — Ambulatory Visit: Payer: Managed Care, Other (non HMO) | Admitting: Gastroenterology

## 2021-03-06 ENCOUNTER — Ambulatory Visit (INDEPENDENT_AMBULATORY_CARE_PROVIDER_SITE_OTHER): Payer: 59 | Admitting: Psychology

## 2021-03-06 DIAGNOSIS — F331 Major depressive disorder, recurrent, moderate: Secondary | ICD-10-CM | POA: Diagnosis not present

## 2021-04-26 ENCOUNTER — Other Ambulatory Visit: Payer: Self-pay

## 2021-04-26 ENCOUNTER — Ambulatory Visit (INDEPENDENT_AMBULATORY_CARE_PROVIDER_SITE_OTHER): Payer: 59 | Admitting: Psychology

## 2021-04-26 DIAGNOSIS — F331 Major depressive disorder, recurrent, moderate: Secondary | ICD-10-CM | POA: Diagnosis not present

## 2021-05-24 ENCOUNTER — Ambulatory Visit (INDEPENDENT_AMBULATORY_CARE_PROVIDER_SITE_OTHER): Payer: 59 | Admitting: Psychology

## 2021-05-24 DIAGNOSIS — F331 Major depressive disorder, recurrent, moderate: Secondary | ICD-10-CM | POA: Diagnosis not present

## 2021-06-21 ENCOUNTER — Ambulatory Visit (INDEPENDENT_AMBULATORY_CARE_PROVIDER_SITE_OTHER): Payer: 59 | Admitting: Psychology

## 2021-06-21 DIAGNOSIS — F331 Major depressive disorder, recurrent, moderate: Secondary | ICD-10-CM | POA: Diagnosis not present

## 2021-07-19 ENCOUNTER — Ambulatory Visit (INDEPENDENT_AMBULATORY_CARE_PROVIDER_SITE_OTHER): Payer: 59 | Admitting: Psychology

## 2021-07-19 DIAGNOSIS — F331 Major depressive disorder, recurrent, moderate: Secondary | ICD-10-CM | POA: Diagnosis not present
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
## 2021-07-20 ENCOUNTER — Emergency Department (HOSPITAL_BASED_OUTPATIENT_CLINIC_OR_DEPARTMENT_OTHER)
Admission: EM | Admit: 2021-07-20 | Discharge: 2021-07-20 | Disposition: A | Discharge status: Home / Self Care | Payer: Managed Care, Other (non HMO) | Attending: Emergency Medicine | Admitting: Emergency Medicine

## 2021-07-20 ENCOUNTER — Emergency Department (HOSPITAL_BASED_OUTPATIENT_CLINIC_OR_DEPARTMENT_OTHER): Payer: Managed Care, Other (non HMO)

## 2021-07-20 ENCOUNTER — Encounter (HOSPITAL_BASED_OUTPATIENT_CLINIC_OR_DEPARTMENT_OTHER): Payer: Self-pay

## 2021-07-20 ENCOUNTER — Other Ambulatory Visit: Payer: Self-pay

## 2021-07-20 DIAGNOSIS — N390 Urinary tract infection, site not specified: Secondary | ICD-10-CM | POA: Insufficient documentation

## 2021-07-20 DIAGNOSIS — R079 Chest pain, unspecified: Secondary | ICD-10-CM | POA: Diagnosis present

## 2021-07-20 DIAGNOSIS — R072 Precordial pain: Secondary | ICD-10-CM | POA: Insufficient documentation

## 2021-07-20 DIAGNOSIS — R1013 Epigastric pain: Secondary | ICD-10-CM | POA: Insufficient documentation

## 2021-07-20 LAB — CBC WITH DIFFERENTIAL/PLATELET
Abs Immature Granulocytes: 0.03 10*3/uL (ref 0.00–0.07)
Basophils Absolute: 0.1 10*3/uL (ref 0.0–0.1)
Basophils Relative: 1 %
Eosinophils Absolute: 2.5 10*3/uL — ABNORMAL HIGH (ref 0.0–0.5)
Eosinophils Relative: 18 %
HCT: 42.8 % (ref 36.0–46.0)
Hemoglobin: 14.8 g/dL (ref 12.0–15.0)
Immature Granulocytes: 0 %
Lymphocytes Relative: 26 %
Lymphs Abs: 3.6 10*3/uL (ref 0.7–4.0)
MCH: 31.6 pg (ref 26.0–34.0)
MCHC: 34.6 g/dL (ref 30.0–36.0)
MCV: 91.3 fL (ref 80.0–100.0)
Monocytes Absolute: 0.8 10*3/uL (ref 0.1–1.0)
Monocytes Relative: 6 %
Neutro Abs: 6.8 10*3/uL (ref 1.7–7.7)
Neutrophils Relative %: 49 %
Platelets: 302 10*3/uL (ref 150–400)
RBC: 4.69 MIL/uL (ref 3.87–5.11)
RDW: 11.5 % (ref 11.5–15.5)
Smear Review: NORMAL
WBC Morphology: >10
WBC: 13.7 10*3/uL — ABNORMAL HIGH (ref 4.0–10.5)
nRBC: 0 % (ref 0.0–0.2)

## 2021-07-20 LAB — URINALYSIS, ROUTINE W REFLEX MICROSCOPIC
Bilirubin Urine: NEGATIVE
Glucose, UA: NEGATIVE mg/dL
Ketones, ur: NEGATIVE mg/dL
Nitrite: NEGATIVE
Protein, ur: NEGATIVE mg/dL
Specific Gravity, Urine: 1.025 (ref 1.005–1.030)
pH: 5.5 (ref 5.0–8.0)

## 2021-07-20 LAB — PREGNANCY, URINE: Preg Test, Ur: NEGATIVE

## 2021-07-20 LAB — COMPREHENSIVE METABOLIC PANEL
ALT: 22 U/L (ref 0–44)
AST: 24 U/L (ref 15–41)
Albumin: 4.8 g/dL (ref 3.5–5.0)
Alkaline Phosphatase: 56 U/L (ref 38–126)
Anion gap: 13 (ref 5–15)
BUN: 17 mg/dL (ref 6–20)
CO2: 22 mmol/L (ref 22–32)
Calcium: 10.2 mg/dL (ref 8.9–10.3)
Chloride: 102 mmol/L (ref 98–111)
Creatinine, Ser: 0.74 mg/dL (ref 0.44–1.00)
GFR, Estimated: >60 mL/min (ref >60)
Glucose, Bld: 92 mg/dL (ref 70–99)
Potassium: 3.5 mmol/L (ref 3.5–5.1)
Sodium: 137 mmol/L (ref 135–145)
Total Bilirubin: 0.8 mg/dL (ref 0.3–1.2)
Total Protein: 8.1 g/dL (ref 6.5–8.1)

## 2021-07-20 LAB — TROPONIN I (HIGH SENSITIVITY): Troponin I (High Sensitivity): <2 ng/L (ref <18)

## 2021-07-20 LAB — URINALYSIS, MICROSCOPIC (REFLEX)

## 2021-07-20 LAB — LIPASE, BLOOD: Lipase: 31 U/L (ref 11–51)

## 2021-07-20 MED ORDER — ONDANSETRON 4 MG PO TBDP
4.0000 mg | ORAL_TABLET | Freq: Once | ORAL | Status: AC
  Administered 2021-07-20: 4 mg via ORAL
  Filled 2021-07-20: qty 1

## 2021-07-20 MED ORDER — MYLANTA MAXIMUM STRENGTH 400-400-40 MG/5ML PO SUSP: 15.0000 mL | Freq: Four times a day (QID) | ORAL | 0 refills | Status: DC | PRN

## 2021-07-20 MED ORDER — CEPHALEXIN 250 MG PO CAPS
500.0000 mg | ORAL_CAPSULE | Freq: Once | ORAL | Status: AC
  Administered 2021-07-20: 500 mg via ORAL
  Filled 2021-07-20: qty 2

## 2021-07-20 MED ORDER — LIDOCAINE VISCOUS HCL 2 % MT SOLN
15.0000 mL | Freq: Once | OROMUCOSAL | Status: AC
  Administered 2021-07-20: 15 mL via ORAL
  Filled 2021-07-20: qty 15

## 2021-07-20 MED ORDER — CEPHALEXIN 500 MG PO CAPS: 500.0000 mg | ORAL_CAPSULE | Freq: Two times a day (BID) | ORAL | 0 refills | Status: AC

## 2021-07-20 MED ORDER — ALUM & MAG HYDROXIDE-SIMETH 200-200-20 MG/5ML PO SUSP
30.0000 mL | Freq: Once | ORAL | Status: AC
  Administered 2021-07-20: 30 mL via ORAL
  Filled 2021-07-20: qty 30
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
## 2021-07-20 NOTE — ED Notes (Signed)
Patient transported to X-ray 

## 2021-07-20 NOTE — Discharge Instructions (Addendum)
You came to the emergency department today to be evaluated for your chest pain and stomach pain.  Your EKG, chest x-ray, and troponin were reassuring that you are not having acute heart attack today.  Your urine pregnancy test, lab work and physical exam are reassuring concerning your epigastric abdominal pain.  The pain improved after you were given a GI cocktail.  I have given you prescription for this medication.  Your urinalysis showed concern for possible urinary tract infection.  Due to this she was started on the antibiotic Keflex.  Please take this medication as prescribed.  Due to your chest pain is very importantly follow-up closely with your primary care provider for repeat evaluation.  You may have diarrhea from the antibiotics.  It is very important that you continue to take the antibiotics even if you get diarrhea unless a medical professional tells you that you may stop taking them.  If you stop too early the bacteria you are being treated for will become stronger and you may need different, more powerful antibiotics that have more side effects and worsening diarrhea.  Please stay well hydrated and consider probiotics as they may decrease the severity of your diarrhea.  Please be aware that if you take any hormonal contraception (birth control pills, nexplanon, the ring, etc) that your birth control will not work while you are taking antibiotics and you need to use back up protection as directed on the birth control medication information insert.

## 2021-07-20 NOTE — ED Notes (Signed)
Discharge instructions dicussed with pt. Pt verbalized understanding with no questions at this time.

## 2021-07-20 NOTE — ED Notes (Signed)
Back from xray, alert, NAD, calm, interactive, steady gait.

## 2021-07-20 NOTE — ED Triage Notes (Signed)
Pt c/o PC started ~530am-also c/o upper abd pain started ~3pm-NAD-steady gait

## 2021-07-24 LAB — PATHOLOGIST SMEAR REVIEW

## 2021-08-01 ENCOUNTER — Emergency Department (HOSPITAL_BASED_OUTPATIENT_CLINIC_OR_DEPARTMENT_OTHER)
Admission: EM | Admit: 2021-08-01 | Discharge: 2021-08-01 | Disposition: A | Discharge status: Home / Self Care | Payer: Managed Care, Other (non HMO) | Attending: Emergency Medicine | Admitting: Emergency Medicine

## 2021-08-01 ENCOUNTER — Telehealth: Payer: Self-pay | Admitting: Gastroenterology

## 2021-08-01 ENCOUNTER — Ambulatory Visit: Payer: Managed Care, Other (non HMO) | Admitting: Family

## 2021-08-01 ENCOUNTER — Emergency Department (HOSPITAL_BASED_OUTPATIENT_CLINIC_OR_DEPARTMENT_OTHER): Payer: Managed Care, Other (non HMO)

## 2021-08-01 ENCOUNTER — Other Ambulatory Visit: Payer: Self-pay

## 2021-08-01 ENCOUNTER — Encounter: Payer: Self-pay | Admitting: *Deleted

## 2021-08-01 DIAGNOSIS — R1011 Right upper quadrant pain: Secondary | ICD-10-CM | POA: Diagnosis present

## 2021-08-01 DIAGNOSIS — R197 Diarrhea, unspecified: Secondary | ICD-10-CM | POA: Diagnosis not present

## 2021-08-01 DIAGNOSIS — R112 Nausea with vomiting, unspecified: Secondary | ICD-10-CM | POA: Diagnosis not present

## 2021-08-01 DIAGNOSIS — R1013 Epigastric pain: Secondary | ICD-10-CM | POA: Insufficient documentation

## 2021-08-01 HISTORY — DX: Anal fissure, unspecified: K60.2

## 2021-08-01 HISTORY — DX: Benign neoplasm of colon, unspecified: D12.6

## 2021-08-01 LAB — URINALYSIS, ROUTINE W REFLEX MICROSCOPIC
Bilirubin Urine: NEGATIVE
Glucose, UA: NEGATIVE mg/dL
Hgb urine dipstick: NEGATIVE
Ketones, ur: NEGATIVE mg/dL
Nitrite: NEGATIVE
Protein, ur: NEGATIVE mg/dL
Specific Gravity, Urine: 1.02 (ref 1.005–1.030)
pH: 7 (ref 5.0–8.0)

## 2021-08-01 LAB — COMPREHENSIVE METABOLIC PANEL
ALT: 20 U/L (ref 0–44)
AST: 23 U/L (ref 15–41)
Albumin: 4.4 g/dL (ref 3.5–5.0)
Alkaline Phosphatase: 48 U/L (ref 38–126)
Anion gap: 9 (ref 5–15)
BUN: 12 mg/dL (ref 6–20)
CO2: 22 mmol/L (ref 22–32)
Calcium: 9.3 mg/dL (ref 8.9–10.3)
Chloride: 105 mmol/L (ref 98–111)
Creatinine, Ser: 0.78 mg/dL (ref 0.44–1.00)
GFR, Estimated: >60 mL/min (ref >60)
Glucose, Bld: 103 mg/dL — ABNORMAL HIGH (ref 70–99)
Potassium: 3.7 mmol/L (ref 3.5–5.1)
Sodium: 136 mmol/L (ref 135–145)
Total Bilirubin: 0.7 mg/dL (ref 0.3–1.2)
Total Protein: 7.6 g/dL (ref 6.5–8.1)

## 2021-08-01 LAB — URINALYSIS, MICROSCOPIC (REFLEX)

## 2021-08-01 LAB — CBC
HCT: 41.4 % (ref 36.0–46.0)
Hemoglobin: 14.4 g/dL (ref 12.0–15.0)
MCH: 31.8 pg (ref 26.0–34.0)
MCHC: 34.8 g/dL (ref 30.0–36.0)
MCV: 91.4 fL (ref 80.0–100.0)
Platelets: 267 10*3/uL (ref 150–400)
RBC: 4.53 MIL/uL (ref 3.87–5.11)
RDW: 11.6 % (ref 11.5–15.5)
WBC: 11.4 10*3/uL — ABNORMAL HIGH (ref 4.0–10.5)
nRBC: 0 % (ref 0.0–0.2)

## 2021-08-01 LAB — PREGNANCY, URINE: Preg Test, Ur: NEGATIVE

## 2021-08-01 LAB — LIPASE, BLOOD: Lipase: 29 U/L (ref 11–51)

## 2021-08-01 MED ORDER — ONDANSETRON HCL 4 MG/2ML IJ SOLN
4.0000 mg | Freq: Once | INTRAMUSCULAR | Status: AC
  Administered 2021-08-01: 22:00:00 4 mg via INTRAVENOUS
  Filled 2021-08-01: qty 2

## 2021-08-01 MED ORDER — MORPHINE SULFATE (PF) 4 MG/ML IV SOLN
4.0000 mg | Freq: Once | INTRAVENOUS | Status: AC
  Administered 2021-08-01: 22:00:00 4 mg via INTRAVENOUS
  Filled 2021-08-01: qty 1

## 2021-08-01 MED ORDER — SODIUM CHLORIDE 0.9 % IV BOLUS
1000.0000 mL | Freq: Once | INTRAVENOUS | Status: AC
  Administered 2021-08-01: 22:00:00 1000 mL via INTRAVENOUS

## 2021-08-01 MED ORDER — HYDROCODONE-ACETAMINOPHEN 5-325 MG PO TABS: 2.0000 | ORAL_TABLET | Freq: Four times a day (QID) | ORAL | 0 refills | Status: DC | PRN

## 2021-08-01 MED ORDER — OMEPRAZOLE 40 MG PO CPDR: 40.0000 mg | DELAYED_RELEASE_CAPSULE | Freq: Every day | ORAL | 0 refills | Status: DC

## 2021-08-01 MED ORDER — ONDANSETRON 4 MG PO TBDP: ORAL_TABLET | ORAL | 0 refills | Status: AC
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
## 2021-08-01 NOTE — Telephone Encounter (Signed)
I agree with your recommendations.  Thank you.

## 2021-08-01 NOTE — ED Notes (Signed)
Verified ride before narcotics given

## 2021-08-01 NOTE — Telephone Encounter (Signed)
Patient has an appointment with Dr. Tarri Glenn on Friday; however, she called today to see if she had any openings either today or tomorrow.  She states she is in extreme pain and would like to be seen sooner, if possible.  Please call patient and advise.  Thank you.

## 2021-08-01 NOTE — Discharge Instructions (Addendum)
Your lab work and ultrasound today were reassuring.  I suspect your symptoms may be due to potential gallbladder dysfunction, versus an ulcer or gastritis.  Please follow the gallbladder eating plan on your paperwork today and follow-up with your GI doctor on Friday as planned.  If you continue having the symptoms you may need a HIDA scan.  To manage symptoms please follow dietary modifications.  Take omeprazole once daily before breakfast to help reduce acid production.  If you develop severe pain or vomiting you can use prescribed pain and nausea medication as needed.  Use caution when taking pain medication as it can cause drowsiness, do not drive.  If you develop fevers, significantly worsened abdominal pain, persistent vomiting or other new or concerning symptoms return for reevaluation.

## 2021-08-01 NOTE — Telephone Encounter (Signed)
Returned pt call to inquire further about her symptoms. States she has been having recurring RUQ abd pain since Saturday resulting in nausea with emesis. States her stools have been very loose and at times, clay-colored, urinary output minimal and dark yellow despite drinking ample fluids daily. Further adds she often becomes very diaphoretic with each episode. Her most recent episode occurred just prior to me calling and lasted for several minutes. Advised pt to proceed to ED for further evaluation and treatment as it seems she is having a GB flare. Further advised we will cancel 12/30 appt as scheduled. Verbalized acceptance and understanding. Routing this message to Dr. Tarri Glenn to make her aware.

## 2021-08-01 NOTE — ED Triage Notes (Signed)
Pt c/o abd pain, n/v/d started 12/16-states she was seen here for same-advised today by GI to come to ED-NAD-steady gait

## 2021-08-02 ENCOUNTER — Other Ambulatory Visit: Payer: Self-pay

## 2021-08-02 DIAGNOSIS — R1011 Right upper quadrant pain: Secondary | ICD-10-CM

## 2021-08-02 DIAGNOSIS — K828 Other specified diseases of gallbladder: Secondary | ICD-10-CM

## 2021-08-03 ENCOUNTER — Ambulatory Visit: Payer: Managed Care, Other (non HMO) | Admitting: Gastroenterology

## 2021-08-09 ENCOUNTER — Ambulatory Visit (HOSPITAL_COMMUNITY): Payer: Managed Care, Other (non HMO)

## 2021-08-16 ENCOUNTER — Ambulatory Visit (HOSPITAL_COMMUNITY)
Admission: RE | Admit: 2021-08-16 | Discharge: 2021-08-16 | Disposition: A | Discharge status: Home / Self Care | Payer: Managed Care, Other (non HMO) | Source: Ambulatory Visit | Attending: Gastroenterology | Admitting: Gastroenterology

## 2021-08-16 DIAGNOSIS — K828 Other specified diseases of gallbladder: Secondary | ICD-10-CM | POA: Diagnosis present

## 2021-08-16 DIAGNOSIS — R1011 Right upper quadrant pain: Secondary | ICD-10-CM | POA: Insufficient documentation

## 2021-08-16 MED ORDER — TECHNETIUM TC 99M MEBROFENIN IV KIT
5.3000 | PACK | Freq: Once | INTRAVENOUS | Status: AC | PRN
  Administered 2021-08-16: 5.3 via INTRAVENOUS

## 2021-08-21 NOTE — Progress Notes (Signed)
Noted  

## 2021-08-22 ENCOUNTER — Ambulatory Visit (INDEPENDENT_AMBULATORY_CARE_PROVIDER_SITE_OTHER): Payer: 59 | Admitting: Psychology

## 2021-08-22 DIAGNOSIS — F331 Major depressive disorder, recurrent, moderate: Secondary | ICD-10-CM | POA: Diagnosis not present

## 2021-08-22 NOTE — Progress Notes (Signed)
[facility] Counselor/Therapist Progress Note  Patient ID: Tracy Munoz, MRN: [id number],    Date: 08/22/2021  Time Spent: 5:00pm-6:00pm   60 minutes   Treatment Type: Individual Therapy  Reported Symptoms: stress, anxiety  Mental Status Exam: Appearance:  Casual     Behavior: Appropriate  Motor: Normal  Speech/Language:  Normal Rate  Affect: Appropriate  Mood: normal  Thought process: normal  Thought content:   WNL  Sensory/Perceptual disturbances:   WNL  Orientation: oriented to person, place, time/date, and situation  Attention: Good  Concentration: Good  Memory: WNL  Fund of knowledge:  Good  Insight:   Good  Judgment:  Good  Impulse Control: Good   Risk Assessment: Danger to Self:  No Self-injurious Behavior: No Danger to Others: No Duty to Warn:no Physical Aggression / Violence:No  Access to Firearms a concern: No  Gang Involvement:No   Subjective: Pt present for face-to-face individual therapy via video Webex.  Pt consents to telehealth video session due to COVID 19 pandemic. Location of pt: home Location of therapist: home office.  Pt talked about being sick with the flu.  She has worked from home the past two days.   Pt went to [place] on vacation a couple of weeks ago and they had a wonderful time.   Pt is glad they had the time together bc Ronalee Belts had been feeling depressed and needed to reset.  Ronalee Belts had told pt he felt like the world would be better off without him.  This concerned pt bc Ronalee Belts tends to hold his feelings in.  Addressed how pt can support Ronalee Belts. Pt talked about work.  She got written up at work bc she has trouble focusing.  Pt states she has ADHD and it is very hard for her to not get overwhelmed.  Addressed the feedback her supervisor gave her.  They are working on coming up with strategies that will help pt improve focus at work.  Worked on Dealer and focusing strategies.  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   Pt talked about "having an epiphany" about how she relates to [place].  She tends to say "I feel like you feel....".    She has realized that she is telling him how to feel and that she does not have a right to do that.   Addressed that she needs to not tell Ronalee Belts how he feels but let him figure it out and own his feelings himself.  Identified that pt has a fear that people won't tell her how they feel and then she will be blind-sided.   Helped pt process her emotions and relationship dynamics.   Provided supportive therapy.     Interventions: Cognitive Behavioral Therapy and Insight-Oriented  Diagnosis: F33.1  Plan: See pt's Treatment Plan for depression in Therapy Charts.  (Treatment Plan Target Date: 10/23/2021) Pt is progressing toward treatment goals.   Plan to continue to see pt monthly.    Eriyah Fernando, LCSW                  Rozlyn Yerby Haynes, LCSW

## 2021-09-27 ENCOUNTER — Ambulatory Visit (INDEPENDENT_AMBULATORY_CARE_PROVIDER_SITE_OTHER): Payer: 59 | Admitting: Psychology

## 2021-09-27 DIAGNOSIS — F331 Major depressive disorder, recurrent, moderate: Secondary | ICD-10-CM | POA: Diagnosis not present

## 2021-09-27 NOTE — Progress Notes (Signed)
°    [facility] Counselor/Therapist Progress Note  Patient ID: Tracy Munoz, MRN: [id number],    Date: 09/27/2021  Time Spent: 5:00pm-6:00pm   60 minutes   Treatment Type: Individual Therapy  Reported Symptoms: stress, anxiety  Mental Status Exam: Appearance:  Casual     Behavior: Appropriate  Motor: Normal  Speech/Language:  Normal Rate  Affect: Appropriate  Mood: normal  Thought process: normal  Thought content:   WNL  Sensory/Perceptual disturbances:   WNL  Orientation: oriented to person, place, time/date, and situation  Attention: Good  Concentration: Good  Memory: WNL  Fund of knowledge:  Good  Insight:   Good  Judgment:  Good  Impulse Control: Good   Risk Assessment: Danger to Self:  No Self-injurious Behavior: No Danger to Others: No Duty to Warn:no Physical Aggression / Violence:No  Access to Firearms a concern: No  Gang Involvement:No   Subjective: Pt present for face-to-face individual therapy via video Webex.  Pt consents to telehealth video session due to COVID 19 pandemic. Location of pt: home Location of therapist: home office.  Pt talked about Tracy Munoz being offered a job in [employer] where he was offered more money.  Pt told him that if he took the job she will not move with him unless he makes a major commitment to her.  They have lived together for 5 years.   Tracy Munoz did not agree to commit more to pt which was upsetting to her.  Pt has realized that she has to think about what she wants.   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  Helped pt process her emotions and relationship dynamics.   Pt has been feeling more grounded and able to set boundaries with Tracy Munoz lately.  Addressed how setting boundaries is difficult for her and it triggers her fear of abandonment.   Helped pt process the issues.   Provided supportive therapy.     Interventions: Cognitive Behavioral Therapy and  Insight-Oriented  Diagnosis: F33.1  Plan: See pt's Treatment Plan for depression in Therapy Charts.  (Treatment Plan Target Date: 10/23/2021) Pt is progressing toward treatment goals.   Plan to continue to see pt monthly.    Tracy Kainz, LCSW

## 2021-10-25 ENCOUNTER — Ambulatory Visit (INDEPENDENT_AMBULATORY_CARE_PROVIDER_SITE_OTHER): Payer: 59 | Admitting: Psychology

## 2021-10-25 DIAGNOSIS — F331 Major depressive disorder, recurrent, moderate: Secondary | ICD-10-CM
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
## 2021-11-29 ENCOUNTER — Ambulatory Visit: Payer: 59 | Admitting: Psychology

## 2022-01-03 ENCOUNTER — Ambulatory Visit (INDEPENDENT_AMBULATORY_CARE_PROVIDER_SITE_OTHER): Payer: 59 | Admitting: Psychology

## 2022-01-03 DIAGNOSIS — F331 Major depressive disorder, recurrent, moderate: Secondary | ICD-10-CM | POA: Diagnosis not present
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
## 2022-01-14 ENCOUNTER — Other Ambulatory Visit (HOSPITAL_COMMUNITY)
Admission: RE | Admit: 2022-01-14 | Discharge: 2022-01-14 | Disposition: A | Discharge status: Home / Self Care | Payer: Managed Care, Other (non HMO) | Source: Ambulatory Visit | Attending: Nurse Practitioner | Admitting: Nurse Practitioner

## 2022-01-14 ENCOUNTER — Encounter: Payer: Self-pay | Admitting: Nurse Practitioner

## 2022-01-14 ENCOUNTER — Ambulatory Visit (INDEPENDENT_AMBULATORY_CARE_PROVIDER_SITE_OTHER): Payer: Managed Care, Other (non HMO) | Admitting: Nurse Practitioner

## 2022-01-14 VITALS — BP 120/80 | HR 72 | Temp 97.8°F | Ht 66.5 in | Wt 149.6 lb

## 2022-01-14 DIAGNOSIS — Z0000 Encounter for general adult medical examination without abnormal findings: Secondary | ICD-10-CM | POA: Diagnosis present

## 2022-01-14 DIAGNOSIS — Z13220 Encounter for screening for lipoid disorders: Secondary | ICD-10-CM | POA: Diagnosis not present

## 2022-01-14 DIAGNOSIS — Z124 Encounter for screening for malignant neoplasm of cervix: Secondary | ICD-10-CM | POA: Diagnosis present

## 2022-01-14 DIAGNOSIS — Z8049 Family history of malignant neoplasm of other genital organs: Secondary | ICD-10-CM

## 2022-01-14 DIAGNOSIS — Z136 Encounter for screening for cardiovascular disorders: Secondary | ICD-10-CM | POA: Diagnosis not present

## 2022-01-14 DIAGNOSIS — Z8041 Family history of malignant neoplasm of ovary: Secondary | ICD-10-CM | POA: Diagnosis not present

## 2022-01-14 DIAGNOSIS — F332 Major depressive disorder, recurrent severe without psychotic features: Secondary | ICD-10-CM

## 2022-01-14 LAB — CBC
HCT: 40.6 % (ref 36.0–46.0)
Hemoglobin: 14 g/dL (ref 12.0–15.0)
MCHC: 34.6 g/dL (ref 30.0–36.0)
MCV: 94 fl (ref 78.0–100.0)
Platelets: 249 10*3/uL (ref 150.0–400.0)
RBC: 4.32 Mil/uL (ref 3.87–5.11)
RDW: 12.2 % (ref 11.5–15.5)
WBC: 6.2 10*3/uL (ref 4.0–10.5)

## 2022-01-14 LAB — LIPID PANEL
Cholesterol: 183 mg/dL (ref 0–200)
HDL: 72.8 mg/dL (ref >39.00)
LDL Cholesterol: 88 mg/dL (ref 0–99)
NonHDL: 109.82
Total CHOL/HDL Ratio: 3
Triglycerides: 108 mg/dL (ref 0.0–149.0)
VLDL: 21.6 mg/dL (ref 0.0–40.0)

## 2022-01-14 LAB — COMPREHENSIVE METABOLIC PANEL
ALT: 16 U/L (ref 0–35)
AST: 22 U/L (ref 0–37)
Albumin: 4.4 g/dL (ref 3.5–5.2)
Alkaline Phosphatase: 53 U/L (ref 39–117)
BUN: 11 mg/dL (ref 6–23)
CO2: 26 mEq/L (ref 19–32)
Calcium: 9.5 mg/dL (ref 8.4–10.5)
Chloride: 103 mEq/L (ref 96–112)
Creatinine, Ser: 0.75 mg/dL (ref 0.40–1.20)
GFR: 105.54 mL/min (ref >60.00)
Glucose, Bld: 84 mg/dL (ref 70–99)
Potassium: 3.8 mEq/L (ref 3.5–5.1)
Sodium: 138 mEq/L (ref 135–145)
Total Bilirubin: 0.6 mg/dL (ref 0.2–1.2)
Total Protein: 7.3 g/dL (ref 6.0–8.3)
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
## 2022-01-14 NOTE — Assessment & Plan Note (Addendum)
Current use of zoloft, ativan and vyvanse Managed by Mora Bellman. Also followed by counselor: [facility] Mood still waxing and waning. Denies any SI/HI/hallucination. No drug or ETOH abuse.

## 2022-01-14 NOTE — Patient Instructions (Signed)
Go to lab You will be contacted to schedule appt with genetic counselor.  Preventive Care 93-32 Years Old, Female Preventive care refers to lifestyle choices and visits with your health care provider that can promote health and wellness. Preventive care visits are also called wellness exams. What can I expect for my preventive care visit? Counseling During your preventive care visit, your health care provider may ask about your: Medical history, including: Past medical problems. Family medical history. Pregnancy history. Current health, including: Menstrual cycle. Method of birth control. Emotional well-being. Home life and relationship well-being. Sexual activity and sexual health. Lifestyle, including: Alcohol, nicotine or tobacco, and drug use. Access to firearms. Diet, exercise, and sleep habits. Work and work Statistician. Sunscreen use. Safety issues such as seatbelt and bike helmet use. Physical exam Your health care provider may check your: Height and weight. These may be used to calculate your BMI (body mass index). BMI is a measurement that tells if you are at a healthy weight. Waist circumference. This measures the distance around your waistline. This measurement also tells if you are at a healthy weight and may help predict your risk of certain diseases, such as type 2 diabetes and high blood pressure. Heart rate and blood pressure. Body temperature. Skin for abnormal spots. What immunizations do I need?  Vaccines are usually given at various ages, according to a schedule. Your health care provider will recommend vaccines for you based on your age, medical history, and lifestyle or other factors, such as travel or where you work. What tests do I need? Screening Your health care provider may recommend screening tests for certain conditions. This may include: Pelvic exam and Pap test. Lipid and cholesterol levels. Diabetes screening. This is done by checking your  blood sugar (glucose) after you have not eaten for a while (fasting). Hepatitis B test. Hepatitis C test. HIV (human immunodeficiency virus) test. STI (sexually transmitted infection) testing, if you are at risk. BRCA-related cancer screening. This may be done if you have a family history of breast, ovarian, tubal, or peritoneal cancers. Talk with your health care provider about your test results, treatment options, and if necessary, the need for more tests. Follow these instructions at home: Eating and drinking  Eat a healthy diet that includes fresh fruits and vegetables, whole grains, lean protein, and low-fat dairy products. Take vitamin and mineral supplements as recommended by your health care provider. Do not drink alcohol if: Your health care provider tells you not to drink. You are pregnant, may be pregnant, or are planning to become pregnant. If you drink alcohol: Limit how much you have to 0-1 drink a day. Know how much alcohol is in your drink. In the [place] one drink equals one 12 oz bottle of beer (355 mL), one 5 oz glass of wine (148 mL), or one 1 oz glass of hard liquor (44 mL). Lifestyle Brush your teeth every morning and night with fluoride toothpaste. Floss one time each day. Exercise for at least 30 minutes 5 or more days each week. Do not use any products that contain nicotine or tobacco. These products include cigarettes, chewing tobacco, and vaping devices, such as e-cigarettes. If you need help quitting, ask your health care provider. Do not use drugs. If you are sexually active, practice safe sex. Use a condom or other form of protection to prevent STIs. If you do not wish to become pregnant, use a form of birth control. If you plan to become pregnant, see your health care  provider for a prepregnancy visit. Find healthy ways to manage stress, such as: Meditation, yoga, or listening to music. Journaling. Talking to a trusted person. Spending time with friends and  family. Minimize exposure to UV radiation to reduce your risk of skin cancer. Safety Always wear your seat belt while driving or riding in a vehicle. Do not drive: If you have been drinking alcohol. Do not ride with someone who has been drinking. If you have been using any mind-altering substances or drugs. While texting. When you are tired or distracted. Wear a helmet and other protective equipment during sports activities. If you have firearms in your house, make sure you follow all gun safety procedures. Seek help if you have been physically or sexually abused. What's next? Go to your health care provider once a year for an annual wellness visit. Ask your health care provider how often you should have your eyes and teeth checked. Stay up to date on all vaccines. This information is not intended to replace advice given to you by your health care provider. Make sure you discuss any questions you have with your health care provider. Document Revised: 01/17/2021 Document Reviewed: 01/17/2021 Elsevier Patient Education  [place].

## 2022-01-15 ENCOUNTER — Telehealth: Payer: Self-pay | Admitting: Genetic Counselor

## 2022-01-15 NOTE — Telephone Encounter (Signed)
Scheduled appt per 6/12 referral. Pt is aware of appt date and time. Pt is aware to arrive 15 mins prior to appt time and to bring and updated insurance card. Pt is aware of appt location.

## 2022-01-16 LAB — CYTOLOGY - PAP
Comment: NEGATIVE
Diagnosis: NEGATIVE
High risk HPV: NEGATIVE

## 2022-01-30 ENCOUNTER — Ambulatory Visit (INDEPENDENT_AMBULATORY_CARE_PROVIDER_SITE_OTHER): Payer: 59 | Admitting: Psychology

## 2022-01-30 DIAGNOSIS — F331 Major depressive disorder, recurrent, moderate: Secondary | ICD-10-CM | POA: Diagnosis not present

## 2022-01-30 NOTE — Progress Notes (Signed)
[facility] Counselor/Therapist Progress Note  Patient ID: Milayna Rotenberg, MRN: [id number],    Date: 01/30/2022  Time Spent: 4:00pm - 4:55pm     55 minutes   Treatment Type: Individual Therapy  Reported Symptoms: stress  Mental Status Exam: Appearance:  Casual     Behavior: Appropriate  Motor: Normal  Speech/Language:  Normal Rate  Affect: Appropriate  Mood: normal  Thought process: normal  Thought content:   WNL  Sensory/Perceptual disturbances:   WNL  Orientation: oriented to person, place, time/date, and situation  Attention: Good  Concentration: Good  Memory: WNL  Fund of knowledge:  Good  Insight:   Good  Judgment:  Good  Impulse Control: Good   Risk Assessment: Danger to Self:  No Self-injurious Behavior: No Danger to Others: No Duty to Warn:no Physical Aggression / Violence:No  Access to Firearms a concern: No  Gang Involvement:No   Subjective: Pt present for face-to-face individual therapy in person.    Pt talked about work.  Work has been going better.    Pt saw her psychiatrist and was proscribed vyvanse.   Pt is feeling that the vyvanse helps her focus and decreases her anxiety.   Pt can focus much better at work.     Pt has applied for intermittent FMLA and has been granted it for her mental health issues.   Pt talked about her relationship with Ronalee Belts.   Pt feels that Mike's mental health has been declining.  Ronalee Belts shared with pt that he worries about a lot and thinks about suicide at times.   Addressed pt's concerns about Ronalee Belts.  Pt feels like their intimacy has decreased as well.  They have not had sex since January.  Pt has asked Ronalee Belts to be more considerate.   She wants to be told things that make her feel important.  Pt does not feel important to [place].  Pt feels the relationship is very unbalanced and that she has to give much more than Ronalee Belts does.  Helped pt process her feelings and relationship dynamics.   Provided supportive therapy.     Interventions: Cognitive Behavioral Therapy and Insight-Oriented  Diagnosis: F33.1  Plan: Plan of Care:  Recommend ongoing therapy.   Pt participated in setting treatment goals.  Plan to meet monthly.   Treatment Plan (Treatment Plan Target Date: 10/26/2022) Client Abilities/Strengths  Pt is bright, engaging, and motivated for therapy.   Client Treatment Preferences  Individual therapy.  Client Statement of Needs  Improve coping skills.  Symptoms  Depressed or irritable mood. Low self-esteem.  Problems Addressed  Unipolar Depression Goals 1. Alleviate depressive symptoms and return to previous level of effective functioning. 2. Appropriately grieve the loss in order to normalize mood and to return to previously adaptive level of functioning. Objective Learn and implement behavioral strategies to overcome depression. Target Date: 2022-10-26 Frequency: Monthly  Progress: 50 Modality: individual  Related Interventions Engage the client in "behavioral activation," increasing his/her activity level and contact with sources of reward, while identifying processes that inhibit activation.  Use behavioral techniques such as instruction, rehearsal, role-playing, role reversal, as needed, to facilitate activity in the client's daily life; reinforce success. Assist the client in developing skills that increase the likelihood of deriving pleasure from behavioral activation (e.g., assertiveness skills, developing an exercise plan, less internal/more external focus, increased social involvement); reinforce success. Objective Identify important people in life, past and present, and describe the quality, good and poor, of those relationships. Target Date: 2022-10-26 Frequency: Monthly  Progress: 50 Modality: individual  Related Interventions Conduct Interpersonal Therapy beginning with the assessment of the client's "interpersonal inventory" of important past and present relationships; develop a  case formulation linking depression to grief, interpersonal role disputes, role transitions, and/or interpersonal deficits). Objective Learn and implement problem-solving and decision-making skills. Target Date: 2022-10-26 Frequency: Monthly  Progress: 50 Modality: individual  Related Interventions Conduct Problem-Solving Therapy using techniques such as psychoeducation, modeling, and role-playing to teach client problem-solving skills (i.e., defining a problem specifically, generating possible solutions, evaluating the pros and cons of each solution, selecting and implementing a plan of action, evaluating the efficacy of the plan, accepting or revising the plan); role-play application of the problem-solving skill to a real life issue. Encourage in the client the development of a positive problem orientation in which problems and solving them are viewed as a natural part of life and not something to be feared, despaired, or avoided. 3. Develop healthy interpersonal relationships that lead to the alleviation and help prevent the relapse of depression. 4. Develop healthy thinking patterns and beliefs about self, others, and the world that lead to the alleviation and help prevent the relapse of depression. 5. Recognize, accept, and cope with feelings of depression. Diagnosis F33.1  Conditions For Discharge Achievement of treatment goals and objectives   Clint Bolder, LCSW

## 2022-02-15 ENCOUNTER — Telehealth: Payer: Self-pay | Admitting: Genetic Counselor

## 2022-02-15 NOTE — Telephone Encounter (Signed)
Converted pt's genetics appt into a virtual visit due to pt recently testing positive for Covid. Pt is aware of the change and instructions were given to her on how to log in for her virtual visit.

## 2022-02-18 ENCOUNTER — Encounter: Payer: Self-pay | Admitting: Genetic Counselor

## 2022-02-18 ENCOUNTER — Inpatient Hospital Stay: Payer: Managed Care, Other (non HMO) | Attending: Genetic Counselor | Admitting: Genetic Counselor

## 2022-02-18 ENCOUNTER — Inpatient Hospital Stay: Payer: Managed Care, Other (non HMO)

## 2022-02-18 DIAGNOSIS — Z1371 Encounter for nonprocreative screening for genetic disease carrier status: Secondary | ICD-10-CM

## 2022-02-18 DIAGNOSIS — Z8041 Family history of malignant neoplasm of ovary: Secondary | ICD-10-CM

## 2022-02-18 NOTE — Progress Notes (Signed)
REFERRING PROVIDER: Flossie Buffy, NP [place],  [place] [id number]  PRIMARY PROVIDER:  Nche, Charlene Brooke, NP  PRIMARY REASON FOR VISIT:  Encounter Diagnosis  Name Primary?   Family history of ovarian cancer Yes    I connected with Ms. Tracy Munoz on 02/18/2022 at [facility] by [place] video conference and verified that I am speaking with the correct person using two identifiers.   Patient location: office Provider location: [facility] office   HISTORY OF PRESENT ILLNESS:   Ms. Tracy Munoz, a 31 y.o. female, was seen for a [facility] cancer genetics consultation at the request of Baldo Ash Nche,NP due to a family history of ovarian cancer.  Ms. Tracy Munoz presents to clinic today to discuss the possibility of a hereditary predisposition to cancer, to discuss genetic testing, and to further clarify her future cancer risks, as well as potential cancer risks for family members.   Ms. Tracy Munoz is a 31 y.o. female with no personal history of cancer.   CANCER HISTORY:  Oncology History   No history exists.    RISK FACTORS:  Mammogram within the last year: no Number of breast biopsies: 0. Colonoscopy: yes;  in 2022; sessile serrated and hyperplastic polyp; f/u in 5 years . Hysterectomy: no.  Ovaries intact: yes; has PCOS Up to date with pelvic exams: most recent PAP in 01/2022. Menarche was at age 29.  Nulliparous. OCP use for approximately  7-10  years.  Any excessive radiation exposure in the past: no Dermatology: no   Past Surgical History:  Procedure Laterality Date   NO PAST SURGERIES       FAMILY HISTORY:  We obtained a detailed, 4-generation family history.  Significant diagnoses are listed below: Family History  Problem Relation Age of Onset   Osteoporosis Mother    Atrial fibrillation Father    Cancer Maternal Grandmother 70       Ovarian   Heart attack Maternal Grandfather    Heart disease Maternal Grandfather 14       with CABG   COPD Paternal  Grandmother    Cancer Paternal Grandmother 75       cervical cancer   Heart attack Paternal Grandfather    Heart disease Paternal Grandfather 70   Diabetes Paternal Grandfather    Colon cancer Paternal Great-grandmother     Ms. Tracy Munoz is unaware of previous family history of genetic testing for hereditary cancer risks.  Family lives in [place].  Affected relatives and her mother were unavailable for genetic testing at this point in time. There is no reported Ashkenazi Jewish ancestry. There is no known consanguinity.  GENETIC COUNSELING ASSESSMENT: Ms. Tracy Munoz is a 31 y.o. female with a family history of ovarian cancer which is somewhat suggestive of a heredtiary cancer syndrome and predisposition to cancer given the presence of related cancers in the family and ages of diagnosis. We, therefore, discussed and recommended the following at today's visit.   DISCUSSION: We discussed that 5 - 10% of cancer is hereditary, with most cases of hereditary ovarian cancer associated with mutations in BRCA1/2.  There are other genes that can be associated with hereditary ovarian cancer syndromes.  We discussed that testing is beneficial for several reasons, including knowing about other cancer risks, identifying potential screening and risk-reduction options that may be appropriate, and to understanding if other family members could be at risk for cancer and allowing them to undergo genetic testing.  We reviewed the characteristics, features and inheritance patterns of hereditary cancer syndromes. We  also discussed genetic testing, including the appropriate family members to test, the process of testing, insurance coverage and turn-around-time for results. We discussed the implications of a negative, positive, carrier and/or variant of uncertain significant result. We discussed that negative results would be uninformative given that Ms. Tracy Munoz does not have a personal history of cancer. We recommended Ms.  Tracy Munoz pursue genetic testing for a panel that contains genes associated with ovarian cancer and other cancers.  The CancerNext-Expanded gene panel offered by [facility] and includes sequencing and rearrangement for the following 77 genes: AIP, ALK, APC, ATM, AXIN2, BAP1, BARD1, BLM, BMPR1A, BRCA1, BRCA2, BRIP1, CDC73, CDH1, CDK4, CDKN1B, CDKN2A, CHEK2, CTNNA1, DICER1, FANCC, FH, FLCN, GALNT12, KIF1B, LZTR1, MAX, MEN1, MET, MLH1, MSH2, MSH3, MSH6, MUTYH, NBN, NF1, NF2, NTHL1, PALB2, PHOX2B, PMS2, POT1, PRKAR1A, PTCH1, PTEN, RAD51C, RAD51D, RB1, RECQL, RET, SDHA, SDHAF2, SDHB, SDHC, SDHD, SMAD4, SMARCA4, SMARCB1, SMARCE1, STK11, SUFU, TMEM127, TP53, TSC1, TSC2, VHL and XRCC2 (sequencing and deletion/duplication); EGFR, EGLN1, HOXB13, KIT, MITF, PDGFRA, POLD1, and POLE (sequencing only); EPCAM and GREM1 (deletion/duplication only).   Based on Ms. Tracy Munoz's family history of ovarian cancer, she meets medical criteria for genetic testing. Despite that she meets criteria, she may still have an out of pocket cost. We discussed that if her out of pocket cost for testing is over $100, the laboratory should contact them to discuss self-pay options and/or patient pay assistance programs.   We discussed that some people do not want to undergo genetic testing due to fear of genetic discrimination.  A federal law called the Genetic Information Non-Discrimination Act (GINA) of 2008 helps protect individuals against genetic discrimination based on their genetic test results.  It impacts both health insurance and employment.  With health insurance, it protects against increased premiums, being kicked off insurance or being forced to take a test in order to be insured.  For employment it protects against hiring, firing and promoting decisions based on genetic test results.  GINA does not apply to those in the [employer], those who work for companies with less than 15 employees, and new life insurance or long-term  disability insurance policies.  Health status due to a cancer diagnosis is not protected under GINA.  PLAN: After considering the risks, benefits, and limitations, Ms. Tracy Munoz provided informed consent to pursue genetic testing.  A saliva sample collection kit will be sent to her home address.  She will then send to [facility] for analysis of the CancerNext-Expanded  Panel. Results should be available within approximately 3 weeks after sample collection, at which point they will be disclosed by telephone to Ms. Tracy Munoz, as will any additional recommendations warranted by these results. Ms. Tracy Munoz will receive a summary of her genetic counseling visit and a copy of her results once available. This information will also be available in Epic.   Based on Ms. Tracy Munoz's family history, we recommended her maternal grandmother, who was diagnosed with ovarian cancer, have genetic counseling/testing. Ms. Tracy Munoz can let us know if we can be of any assistance in coordinating genetic counseling and/or testing for this family member.   Lastly, we encouraged Ms. Tracy Munoz to remain in contact with cancer genetics annually so that we can continuously update the family history and inform her of any changes in cancer genetics and testing that may be of benefit for this family.   Ms. Tracy Munoz questions were answered to her satisfaction today. Our contact information was provided should additional questions or concerns arise. Thank you for the referral  and allowing [place] to share in the care of your patient.   Kushal Saunders M. Joette Catching, [place], [facility] Genetic Counselor [email or web address] (P) [phone]   The patient was seen for a total of 30 minutes in face-to-face genetic counseling.  The patient was seen alone.  Drs. Lindi Adie and/or Burr Medico were available to discuss this case as needed.  _______________________________________________________________________ For Office Staff:  Number of people involved in  session: 1 Was an Intern/ student involved with case: no

## 2022-03-12 ENCOUNTER — Ambulatory Visit (INDEPENDENT_AMBULATORY_CARE_PROVIDER_SITE_OTHER): Payer: 59 | Admitting: Psychology

## 2022-03-12 DIAGNOSIS — F331 Major depressive disorder, recurrent, moderate: Secondary | ICD-10-CM | POA: Diagnosis not present

## 2022-03-12 NOTE — Progress Notes (Signed)
[facility] Counselor/Therapist Progress Note  Patient ID: Tracy Munoz, MRN: [id number],    Date: 03/12/2022  Time Spent: 3:00pm - 3:55pm     55 minutes   Treatment Type: Individual Therapy  Reported Symptoms: stress  Mental Status Exam: Appearance:  Casual     Behavior: Appropriate  Motor: Normal  Speech/Language:  Normal Rate  Affect: Appropriate  Mood: normal  Thought process: normal  Thought content:   WNL  Sensory/Perceptual disturbances:   WNL  Orientation: oriented to person, place, time/date, and situation  Attention: Good  Concentration: Good  Memory: WNL  Fund of knowledge:  Good  Insight:   Good  Judgment:  Good  Impulse Control: Good   Risk Assessment: Danger to Self:  No Self-injurious Behavior: No Danger to Others: No Duty to Warn:no Physical Aggression / Violence:No  Access to Firearms a concern: No  Gang Involvement:No   Subjective: Pt present for face-to-face individual therapy in person.    Pt talked about Ronalee Belts breaking up with her.  He is planning to move out.  Pt will continue to stay in the house they are renting.  Addressed what triggered the breakup.   Ronalee Belts was suicidal a few weeks ago.  It was a wake up call for him.  He decided to quit drinking and be sober and decided he needs to be alone and not in a relationship.  Pt and Ronalee Belts had been together for 6 years.  Pt was tearful as she talked about the loss of the relationship and that it is final and she needs to learn to cope without him.  Pt recognizes that in many ways the relationship was not working for her either.   Helped pt process her feelings and grief.   Worked on [employer], therapeutic.  Pt has been reaching out to her support system of friends and family.   Addressed how pt can be kind and compassionate with herself and increase self care.   Provided supportive therapy.    Interventions: Cognitive Behavioral Therapy and Insight-Oriented  Diagnosis: F33.1  Plan: Plan of  Care:  Recommend ongoing therapy.   Pt participated in setting treatment goals.  Plan to meet monthly.   Treatment Plan (Treatment Plan Target Date: 10/26/2022) Client Abilities/Strengths  Pt is bright, engaging, and motivated for therapy.   Client Treatment Preferences  Individual therapy.  Client Statement of Needs  Improve coping skills.  Symptoms  Depressed or irritable mood. Low self-esteem.  Problems Addressed  Unipolar Depression Goals 1. Alleviate depressive symptoms and return to previous level of effective functioning. 2. Appropriately grieve the loss in order to normalize mood and to return to previously adaptive level of functioning. Objective Learn and implement behavioral strategies to overcome depression. Target Date: 2022-10-26 Frequency: Monthly  Progress: 50 Modality: individual  Related Interventions Engage the client in "behavioral activation," increasing his/her activity level and contact with sources of reward, while identifying processes that inhibit activation.  Use behavioral techniques such as instruction, rehearsal, role-playing, role reversal, as needed, to facilitate activity in the client's daily life; reinforce success. Assist the client in developing skills that increase the likelihood of deriving pleasure from behavioral activation (e.g., assertiveness skills, developing an exercise plan, less internal/more external focus, increased social involvement); reinforce success. Objective Identify important people in life, past and present, and describe the quality, good and poor, of those relationships. Target Date: 2022-10-26 Frequency: Monthly  Progress: 50 Modality: individual  Related Interventions Conduct Interpersonal Therapy beginning with the assessment of the  client's "interpersonal inventory" of important past and present relationships; develop a case formulation linking depression to grief, interpersonal role disputes, role transitions, and/or  interpersonal deficits). Objective Learn and implement problem-solving and decision-making skills. Target Date: 2022-10-26 Frequency: Monthly  Progress: 50 Modality: individual  Related Interventions Conduct Problem-Solving Therapy using techniques such as psychoeducation, modeling, and role-playing to teach client problem-solving skills (i.e., defining a problem specifically, generating possible solutions, evaluating the pros and cons of each solution, selecting and implementing a plan of action, evaluating the efficacy of the plan, accepting or revising the plan); role-play application of the problem-solving skill to a real life issue. Encourage in the client the development of a positive problem orientation in which problems and solving them are viewed as a natural part of life and not something to be feared, despaired, or avoided. 3. Develop healthy interpersonal relationships that lead to the alleviation and help prevent the relapse of depression. 4. Develop healthy thinking patterns and beliefs about self, others, and the world that lead to the alleviation and help prevent the relapse of depression. 5. Recognize, accept, and cope with feelings of depression. Diagnosis F33.1  Conditions For Discharge Achievement of treatment goals and objectives   Clint Bolder, LCSW

## 2022-04-17 ENCOUNTER — Ambulatory Visit: Payer: 59 | Admitting: Psychology

## 2022-05-15 ENCOUNTER — Ambulatory Visit (INDEPENDENT_AMBULATORY_CARE_PROVIDER_SITE_OTHER): Payer: 59 | Admitting: Psychology

## 2022-05-15 DIAGNOSIS — F331 Major depressive disorder, recurrent, moderate: Secondary | ICD-10-CM

## 2022-05-15 NOTE — Progress Notes (Signed)
[facility] Counselor/Therapist Progress Note  Patient ID: Tracy Munoz, MRN: [id number],    Date: 05/15/2022  Time Spent: 5:00pm - 5:55pm     55 minutes   Treatment Type: Individual Therapy  Reported Symptoms: stress  Mental Status Exam: Appearance:  Casual     Behavior: Appropriate  Motor: Normal  Speech/Language:  Normal Rate  Affect: Appropriate  Mood: normal  Thought process: normal  Thought content:   WNL  Sensory/Perceptual disturbances:   WNL  Orientation: oriented to person, place, time/date, and situation  Attention: Good  Concentration: Good  Memory: WNL  Fund of knowledge:  Good  Insight:   Good  Judgment:  Good  Impulse Control: Good   Risk Assessment: Danger to Self:  No Self-injurious Behavior: No Danger to Others: No Duty to Warn:no Physical Aggression / Violence:No  Access to Firearms a concern: No  Gang Involvement:No   Subjective: Pt present for face-to-face individual therapy in person.    Pt talked about Ronalee Belts planning ot move out this weekend.   Pt is very sad and grieving.  Pt's aunt and mother will be with her to provide support.   Pt was given a performance review at her job today and was told she has 45 days to improve or she may lose her job.   Pt was tearful as she talked about the sadness and worry about her breakup and job  situation.   Helped pt process her feelings. Worked on [employer], therapeutic.     Addressed how pt can be kind and compassionate with herself and increase self care.   Provided supportive therapy.    Interventions: Cognitive Behavioral Therapy and Insight-Oriented  Diagnosis: F33.1  Plan: Plan of Care:  Recommend ongoing therapy.   Pt participated in setting treatment goals.  Plan to meet monthly.   Treatment Plan (Treatment Plan Target Date: 10/26/2022) Client Abilities/Strengths  Pt is bright, engaging, and motivated for therapy.   Client Treatment Preferences  Individual therapy.  Client  Statement of Needs  Improve coping skills.  Symptoms  Depressed or irritable mood. Low self-esteem.  Problems Addressed  Unipolar Depression Goals 1. Alleviate depressive symptoms and return to previous level of effective functioning. 2. Appropriately grieve the loss in order to normalize mood and to return to previously adaptive level of functioning. Objective Learn and implement behavioral strategies to overcome depression. Target Date: 2022-10-26 Frequency: Monthly  Progress: 50 Modality: individual  Related Interventions Engage the client in "behavioral activation," increasing his/her activity level and contact with sources of reward, while identifying processes that inhibit activation.  Use behavioral techniques such as instruction, rehearsal, role-playing, role reversal, as needed, to facilitate activity in the client's daily life; reinforce success. Assist the client in developing skills that increase the likelihood of deriving pleasure from behavioral activation (e.g., assertiveness skills, developing an exercise plan, less internal/more external focus, increased social involvement); reinforce success. Objective Identify important people in life, past and present, and describe the quality, good and poor, of those relationships. Target Date: 2022-10-26 Frequency: Monthly  Progress: 50 Modality: individual  Related Interventions Conduct Interpersonal Therapy beginning with the assessment of the client's "interpersonal inventory" of important past and present relationships; develop a case formulation linking depression to grief, interpersonal role disputes, role transitions, and/or interpersonal deficits). Objective Learn and implement problem-solving and decision-making skills. Target Date: 2022-10-26 Frequency: Monthly  Progress: 50 Modality: individual  Related Interventions Conduct Problem-Solving Therapy using techniques such as psychoeducation, modeling, and role-playing to  teach client problem-solving skills (  i.e., defining a problem specifically, generating possible solutions, evaluating the pros and cons of each solution, selecting and implementing a plan of action, evaluating the efficacy of the plan, accepting or revising the plan); role-play application of the problem-solving skill to a real life issue. Encourage in the client the development of a positive problem orientation in which problems and solving them are viewed as a natural part of life and not something to be feared, despaired, or avoided. 3. Develop healthy interpersonal relationships that lead to the alleviation and help prevent the relapse of depression. 4. Develop healthy thinking patterns and beliefs about self, others, and the world that lead to the alleviation and help prevent the relapse of depression. 5. Recognize, accept, and cope with feelings of depression. Diagnosis F33.1  Conditions For Discharge Achievement of treatment goals and objectives   Clint Bolder, LCSW

## 2022-06-12 ENCOUNTER — Ambulatory Visit (INDEPENDENT_AMBULATORY_CARE_PROVIDER_SITE_OTHER): Payer: 59 | Admitting: Psychology

## 2022-06-12 DIAGNOSIS — F331 Major depressive disorder, recurrent, moderate: Secondary | ICD-10-CM

## 2022-06-12 NOTE — Progress Notes (Signed)
[facility] Counselor/Therapist Progress Note  Patient ID: Vernona Peake, MRN: [id number],    Date: 06/12/2022  Time Spent: 4:00pm - 4:55pm     55 minutes   Treatment Type: Individual Therapy  Reported Symptoms: stress  Mental Status Exam: Appearance:  Casual     Behavior: Appropriate  Motor: Normal  Speech/Language:  Normal Rate  Affect: Appropriate  Mood: normal  Thought process: normal  Thought content:   WNL  Sensory/Perceptual disturbances:   WNL  Orientation: oriented to person, place, time/date, and situation  Attention: Good  Concentration: Good  Memory: WNL  Fund of knowledge:  Good  Insight:   Good  Judgment:  Good  Impulse Control: Good   Risk Assessment: Danger to Self:  No Self-injurious Behavior: No Danger to Others: No Duty to Warn:no Physical Aggression / Violence:No  Access to Firearms a concern: No  Gang Involvement:No   Subjective: Pt present for face-to-face individual therapy in person.    Pt talked about Ronalee Belts moving out a couple of weeks ago.  Pt's mother came to stay with her to support her.  Pt was tearful as she talked about how the move went.  Pt is grieving the end of the relationship.  Helped pt process her feelings and grief.   Pt talked about work.   She was offered a termination package of 3 months of severance pay and benefits.  Pt's last day was Monday.  Pt is actively looking for another job.  Pt has a second interview for a new job tomorrow.   Pt has had increased anxiety since the break up with Ronalee Belts.   She is anxious about doing things alone.  Addressed the things that trigger pt's anxiety.  She has not wanted to even go to the grocery store.   Worked on calming strategies and thought reframing.       Addressed how pt can be kind and compassionate with herself and increase self care.   Provided supportive therapy.    Interventions: Cognitive Behavioral Therapy and Insight-Oriented  Diagnosis: F33.1  Plan: Plan  of Care:  Recommend ongoing therapy.   Pt participated in setting treatment goals.  Plan to meet monthly.   Treatment Plan (Treatment Plan Target Date: 10/26/2022) Client Abilities/Strengths  Pt is bright, engaging, and motivated for therapy.   Client Treatment Preferences  Individual therapy.  Client Statement of Needs  Improve coping skills.  Symptoms  Depressed or irritable mood. Low self-esteem.  Problems Addressed  Unipolar Depression Goals 1. Alleviate depressive symptoms and return to previous level of effective functioning. 2. Appropriately grieve the loss in order to normalize mood and to return to previously adaptive level of functioning. Objective Learn and implement behavioral strategies to overcome depression. Target Date: 2022-10-26 Frequency: Monthly  Progress: 50 Modality: individual  Related Interventions Engage the client in "behavioral activation," increasing his/her activity level and contact with sources of reward, while identifying processes that inhibit activation.  Use behavioral techniques such as instruction, rehearsal, role-playing, role reversal, as needed, to facilitate activity in the client's daily life; reinforce success. Assist the client in developing skills that increase the likelihood of deriving pleasure from behavioral activation (e.g., assertiveness skills, developing an exercise plan, less internal/more external focus, increased social involvement); reinforce success. Objective Identify important people in life, past and present, and describe the quality, good and poor, of those relationships. Target Date: 2022-10-26 Frequency: Monthly  Progress: 50 Modality: individual  Related Interventions Conduct Interpersonal Therapy beginning with the assessment of the  client's "interpersonal inventory" of important past and present relationships; develop a case formulation linking depression to grief, interpersonal role disputes, role transitions, and/or  interpersonal deficits). Objective Learn and implement problem-solving and decision-making skills. Target Date: 2022-10-26 Frequency: Monthly  Progress: 50 Modality: individual  Related Interventions Conduct Problem-Solving Therapy using techniques such as psychoeducation, modeling, and role-playing to teach client problem-solving skills (i.e., defining a problem specifically, generating possible solutions, evaluating the pros and cons of each solution, selecting and implementing a plan of action, evaluating the efficacy of the plan, accepting or revising the plan); role-play application of the problem-solving skill to a real life issue. Encourage in the client the development of a positive problem orientation in which problems and solving them are viewed as a natural part of life and not something to be feared, despaired, or avoided. 3. Develop healthy interpersonal relationships that lead to the alleviation and help prevent the relapse of depression. 4. Develop healthy thinking patterns and beliefs about self, others, and the world that lead to the alleviation and help prevent the relapse of depression. 5. Recognize, accept, and cope with feelings of depression. Diagnosis F33.1  Conditions For Discharge Achievement of treatment goals and objectives   Clint Bolder, LCSW

## 2022-07-10 ENCOUNTER — Ambulatory Visit (INDEPENDENT_AMBULATORY_CARE_PROVIDER_SITE_OTHER): Payer: Self-pay | Admitting: Psychology

## 2022-07-10 DIAGNOSIS — F331 Major depressive disorder, recurrent, moderate: Secondary | ICD-10-CM

## 2022-07-10 NOTE — Progress Notes (Signed)
[facility] Counselor/Therapist Progress Note  Patient ID: Keaisha Sublette, MRN: [id number],    Date: 07/10/2022  Time Spent: 4:00pm - 4:55pm     55 minutes   Treatment Type: Individual Therapy  Reported Symptoms: stress  Mental Status Exam: Appearance:  Casual     Behavior: Appropriate  Motor: Normal  Speech/Language:  Normal Rate  Affect: Appropriate  Mood: normal  Thought process: normal  Thought content:   WNL  Sensory/Perceptual disturbances:   WNL  Orientation: oriented to person, place, time/date, and situation  Attention: Good  Concentration: Good  Memory: WNL  Fund of knowledge:  Good  Insight:   Good  Judgment:  Good  Impulse Control: Good   Risk Assessment: Danger to Self:  No Self-injurious Behavior: No Danger to Others: No Duty to Warn:no Physical Aggression / Violence:No  Access to Firearms a concern: No  Gang Involvement:No   Subjective: Pt present for face-to-face individual therapy in person.    Pt talked about her job situation.  She has had job interviews and was hopeful but was not given the jobs.   Pt is feeling anxious about the job search to the point of having anxiety "paralysis" in terms of making applications on line.   Worked on problem solving how pt can follow through with the job search. Worked on calming strategies and thought reframing.     Pt talked about feeling sad about the breakup with Ronalee Belts.   Helped pt process her feelings.    She has recently started to see another guy Ted.   Addressed how pt can be kind and compassionate with herself and increase self care.   Provided supportive therapy.    Interventions: Cognitive Behavioral Therapy and Insight-Oriented  Diagnosis: F33.1  Plan: Plan of Care:  Recommend ongoing therapy.   Pt participated in setting treatment goals.  Plan to meet monthly.   Treatment Plan (Treatment Plan Target Date: 10/26/2022) Client Abilities/Strengths  Pt is bright, engaging, and  motivated for therapy.   Client Treatment Preferences  Individual therapy.  Client Statement of Needs  Improve coping skills.  Symptoms  Depressed or irritable mood. Low self-esteem.  Problems Addressed  Unipolar Depression Goals 1. Alleviate depressive symptoms and return to previous level of effective functioning. 2. Appropriately grieve the loss in order to normalize mood and to return to previously adaptive level of functioning. Objective Learn and implement behavioral strategies to overcome depression. Target Date: 2022-10-26 Frequency: Monthly  Progress: 50 Modality: individual  Related Interventions Engage the client in "behavioral activation," increasing his/her activity level and contact with sources of reward, while identifying processes that inhibit activation.  Use behavioral techniques such as instruction, rehearsal, role-playing, role reversal, as needed, to facilitate activity in the client's daily life; reinforce success. Assist the client in developing skills that increase the likelihood of deriving pleasure from behavioral activation (e.g., assertiveness skills, developing an exercise plan, less internal/more external focus, increased social involvement); reinforce success. Objective Identify important people in life, past and present, and describe the quality, good and poor, of those relationships. Target Date: 2022-10-26 Frequency: Monthly  Progress: 50 Modality: individual  Related Interventions Conduct Interpersonal Therapy beginning with the assessment of the client's "interpersonal inventory" of important past and present relationships; develop a case formulation linking depression to grief, interpersonal role disputes, role transitions, and/or interpersonal deficits). Objective Learn and implement problem-solving and decision-making skills. Target Date: 2022-10-26 Frequency: Monthly  Progress: 50 Modality: individual  Related Interventions Conduct  Problem-Solving Therapy using techniques such  as psychoeducation, modeling, and role-playing to teach client problem-solving skills (i.e., defining a problem specifically, generating possible solutions, evaluating the pros and cons of each solution, selecting and implementing a plan of action, evaluating the efficacy of the plan, accepting or revising the plan); role-play application of the problem-solving skill to a real life issue. Encourage in the client the development of a positive problem orientation in which problems and solving them are viewed as a natural part of life and not something to be feared, despaired, or avoided. 3. Develop healthy interpersonal relationships that lead to the alleviation and help prevent the relapse of depression. 4. Develop healthy thinking patterns and beliefs about self, others, and the world that lead to the alleviation and help prevent the relapse of depression. 5. Recognize, accept, and cope with feelings of depression. Diagnosis F33.1  Conditions For Discharge Achievement of treatment goals and objectives   Clint Bolder, LCSW

## 2022-08-29 ENCOUNTER — Ambulatory Visit (INDEPENDENT_AMBULATORY_CARE_PROVIDER_SITE_OTHER): Payer: Self-pay | Admitting: Psychology

## 2022-08-29 DIAGNOSIS — F331 Major depressive disorder, recurrent, moderate: Secondary | ICD-10-CM

## 2022-08-29 NOTE — Progress Notes (Signed)
[facility] Counselor/Therapist Progress Note  Patient ID: Melanee Cordial, MRN: [id number],    Date: 08/29/2022  Time Spent: 5:00pm - 5:55pm     55 minutes   Treatment Type: Individual Therapy  Reported Symptoms: stress  Mental Status Exam: Appearance:  Casual     Behavior: Appropriate  Motor: Normal  Speech/Language:  Normal Rate  Affect: Appropriate  Mood: normal  Thought process: normal  Thought content:   WNL  Sensory/Perceptual disturbances:   WNL  Orientation: oriented to person, place, time/date, and situation  Attention: Good  Concentration: Good  Memory: WNL  Fund of knowledge:  Good  Insight:   Good  Judgment:  Good  Impulse Control: Good   Risk Assessment: Danger to Self:  No Self-injurious Behavior: No Danger to Others: No Duty to Warn:no Physical Aggression / Violence:No  Access to Firearms a concern: No  Gang Involvement:No   Subjective: Pt present for face-to-face individual therapy via video Webex.  Pt consents to telehealth video session due to COVID 19 pandemic. Location of pt: home Location of therapist: home office.    Pt talked about the job search.  She has sent a lot of resumes out but they did not yield results.   Pt has decided to move back to [place] when her lease is up in March.  Pt will live with her grandmother and provide care for her and be paid for her work.  Pt feels like it is the reset that she needs.  Pt states the decision to move is bitter sweet bc she has been in [place] since 2018.  Pt was tearful as she talked about the upcoming change.  Addressed the pros and cons of pt's decision and she feels the pros out weigh the cons.  Addressed the issues pt is concerned about. Taking care of her grandmother gives pt a sense of purpose.   Pt has had some discussions with Ronalee Belts and feels like she is getting some closure about their breakup.  Helped pt process her feelings.   Addressed how pt can be kind and compassionate with  herself and increase self care.   Provided supportive therapy.    Interventions: Cognitive Behavioral Therapy and Insight-Oriented  Diagnosis: F33.1  Plan: Plan of Care:  Recommend ongoing therapy.   Pt participated in setting treatment goals.  Plan to meet monthly.   Treatment Plan (Treatment Plan Target Date: 10/26/2022) Client Abilities/Strengths  Pt is bright, engaging, and motivated for therapy.   Client Treatment Preferences  Individual therapy.  Client Statement of Needs  Improve coping skills.  Symptoms  Depressed or irritable mood. Low self-esteem.  Problems Addressed  Unipolar Depression Goals 1. Alleviate depressive symptoms and return to previous level of effective functioning. 2. Appropriately grieve the loss in order to normalize mood and to return to previously adaptive level of functioning. Objective Learn and implement behavioral strategies to overcome depression. Target Date: 2022-10-26 Frequency: Monthly  Progress: 50 Modality: individual  Related Interventions Engage the client in "behavioral activation," increasing his/her activity level and contact with sources of reward, while identifying processes that inhibit activation.  Use behavioral techniques such as instruction, rehearsal, role-playing, role reversal, as needed, to facilitate activity in the client's daily life; reinforce success. Assist the client in developing skills that increase the likelihood of deriving pleasure from behavioral activation (e.g., assertiveness skills, developing an exercise plan, less internal/more external focus, increased social involvement); reinforce success. Objective Identify important people in life, past and present, and describe the quality,  good and poor, of those relationships. Target Date: 2022-10-26 Frequency: Monthly  Progress: 50 Modality: individual  Related Interventions Conduct Interpersonal Therapy beginning with the assessment of the client's "interpersonal  inventory" of important past and present relationships; develop a case formulation linking depression to grief, interpersonal role disputes, role transitions, and/or interpersonal deficits). Objective Learn and implement problem-solving and decision-making skills. Target Date: 2022-10-26 Frequency: Monthly  Progress: 50 Modality: individual  Related Interventions Conduct Problem-Solving Therapy using techniques such as psychoeducation, modeling, and role-playing to teach client problem-solving skills (i.e., defining a problem specifically, generating possible solutions, evaluating the pros and cons of each solution, selecting and implementing a plan of action, evaluating the efficacy of the plan, accepting or revising the plan); role-play application of the problem-solving skill to a real life issue. Encourage in the client the development of a positive problem orientation in which problems and solving them are viewed as a natural part of life and not something to be feared, despaired, or avoided. 3. Develop healthy interpersonal relationships that lead to the alleviation and help prevent the relapse of depression. 4. Develop healthy thinking patterns and beliefs about self, others, and the world that lead to the alleviation and help prevent the relapse of depression. 5. Recognize, accept, and cope with feelings of depression. Diagnosis F33.1  Conditions For Discharge Achievement of treatment goals and objectives   Clint Bolder, LCSW

## 2022-09-23 ENCOUNTER — Ambulatory Visit (INDEPENDENT_AMBULATORY_CARE_PROVIDER_SITE_OTHER): Payer: Self-pay | Admitting: Psychology

## 2022-09-23 DIAGNOSIS — F331 Major depressive disorder, recurrent, moderate: Secondary | ICD-10-CM

## 2022-09-23 NOTE — Progress Notes (Signed)
[facility] Counselor/Therapist Progress Note  Patient ID: Tracy Munoz, MRN: [id number],    Date: 09/23/2022  Time Spent: 5:00pm - 5:55pm     55 minutes   Treatment Type: Individual Therapy  Reported Symptoms: stress, grief  Mental Status Exam: Appearance:  Casual     Behavior: Appropriate  Motor: Normal  Speech/Language:  Normal Rate  Affect: Appropriate  Mood: normal  Thought process: normal  Thought content:   WNL  Sensory/Perceptual disturbances:   WNL  Orientation: oriented to person, place, time/date, and situation  Attention: Good  Concentration: Good  Memory: WNL  Fund of knowledge:  Good  Insight:   Good  Judgment:  Good  Impulse Control: Good   Risk Assessment: Danger to Self:  No Self-injurious Behavior: No Danger to Others: No Duty to Warn:no Physical Aggression / Violence:No  Access to Firearms a concern: No  Gang Involvement:No   Subjective: Pt present for face-to-face individual therapy via video Webex.  Pt consents to telehealth video session due to COVID 19 pandemic. Location of pt: home Location of therapist: home office.    Pt talked about the upcoming move to [place].   Pt has a lot of mixed feelings about it.  She knows the move will be good for her but she also often feels like she does not want to do it and is wrestling with the decision emotionally.  Helped pt process the issues and identified that she is grieving having to leave [place].   She is experiencing the anger phase of grief.  Helped pt give voice to the anger. Addressed how she can move through the anger to acceptance.   Addressed how pt can be kind and compassionate with herself and increase self care.   Provided supportive therapy.    Interventions: Cognitive Behavioral Therapy and Insight-Oriented  Diagnosis: F33.1  Plan: Plan of Care:  Recommend ongoing therapy.   Pt participated in setting treatment goals.  Plan to meet monthly.   Treatment Plan (Treatment  Plan Target Date: 10/26/2022) Client Abilities/Strengths  Pt is bright, engaging, and motivated for therapy.   Client Treatment Preferences  Individual therapy.  Client Statement of Needs  Improve coping skills.  Symptoms  Depressed or irritable mood. Low self-esteem.  Problems Addressed  Unipolar Depression Goals 1. Alleviate depressive symptoms and return to previous level of effective functioning. 2. Appropriately grieve the loss in order to normalize mood and to return to previously adaptive level of functioning. Objective Learn and implement behavioral strategies to overcome depression. Target Date: 2022-10-26 Frequency: Monthly  Progress: 50 Modality: individual  Related Interventions Engage the client in "behavioral activation," increasing his/her activity level and contact with sources of reward, while identifying processes that inhibit activation.  Use behavioral techniques such as instruction, rehearsal, role-playing, role reversal, as needed, to facilitate activity in the client's daily life; reinforce success. Assist the client in developing skills that increase the likelihood of deriving pleasure from behavioral activation (e.g., assertiveness skills, developing an exercise plan, less internal/more external focus, increased social involvement); reinforce success. Objective Identify important people in life, past and present, and describe the quality, good and poor, of those relationships. Target Date: 2022-10-26 Frequency: Monthly  Progress: 50 Modality: individual  Related Interventions Conduct Interpersonal Therapy beginning with the assessment of the client's "interpersonal inventory" of important past and present relationships; develop a case formulation linking depression to grief, interpersonal role disputes, role transitions, and/or interpersonal deficits). Objective Learn and implement problem-solving and decision-making skills. Target Date: 2022-10-26 Frequency:  Monthly  Progress: 50 Modality: individual  Related Interventions Conduct Problem-Solving Therapy using techniques such as psychoeducation, modeling, and role-playing to teach client problem-solving skills (i.e., defining a problem specifically, generating possible solutions, evaluating the pros and cons of each solution, selecting and implementing a plan of action, evaluating the efficacy of the plan, accepting or revising the plan); role-play application of the problem-solving skill to a real life issue. Encourage in the client the development of a positive problem orientation in which problems and solving them are viewed as a natural part of life and not something to be feared, despaired, or avoided. 3. Develop healthy interpersonal relationships that lead to the alleviation and help prevent the relapse of depression. 4. Develop healthy thinking patterns and beliefs about self, others, and the world that lead to the alleviation and help prevent the relapse of depression. 5. Recognize, accept, and cope with feelings of depression. Diagnosis F33.1  Conditions For Discharge Achievement of treatment goals and objectives   Tracy Bolder, LCSW

## 2022-10-09 IMAGING — DX DG CHEST 2V
2 series · 2 of 2 positions shown · non-contrast
Comparison: 06/14/2019

CLINICAL DATA: Chest pain

EXAM:
CHEST - 2 VIEW

[chest pa]
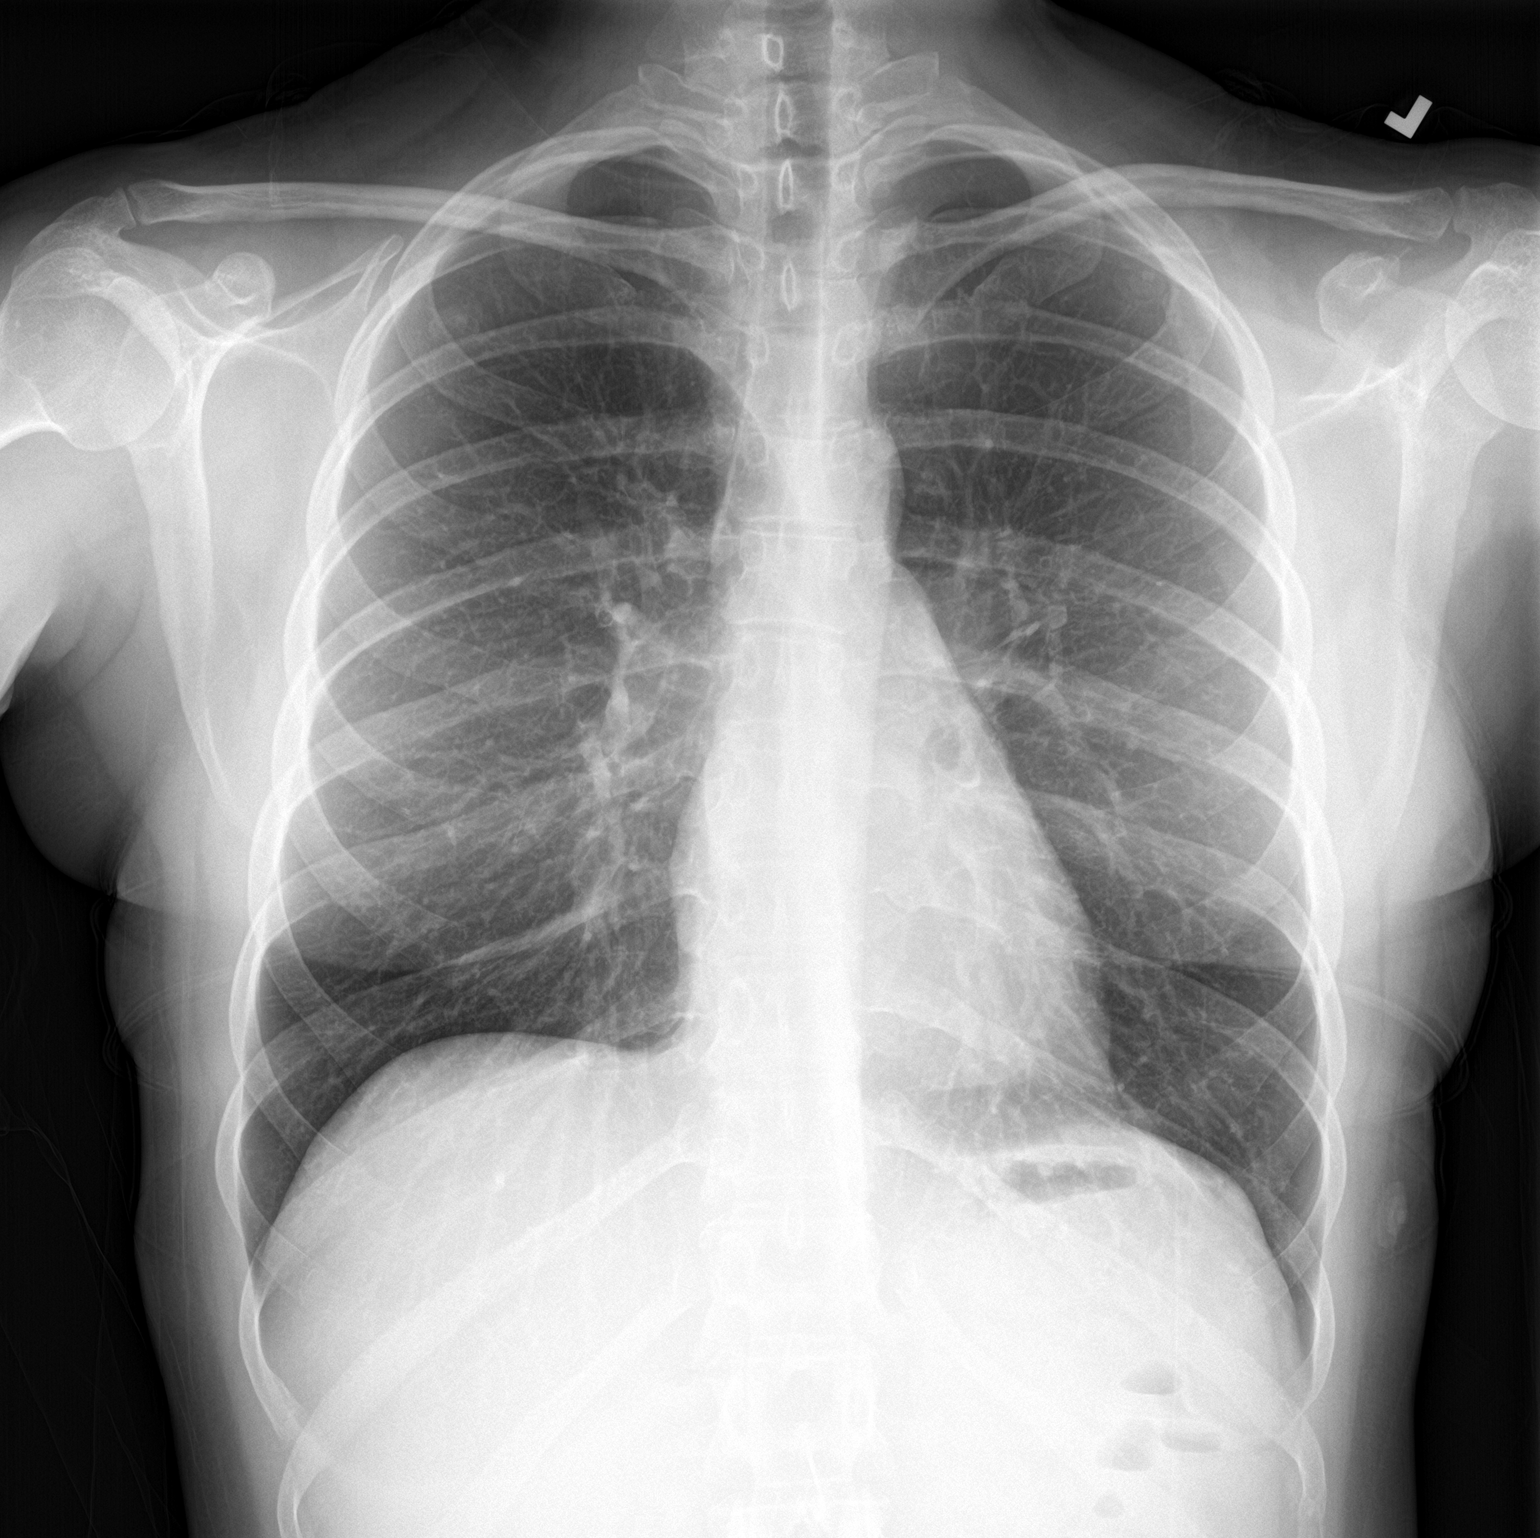

[chest lat]
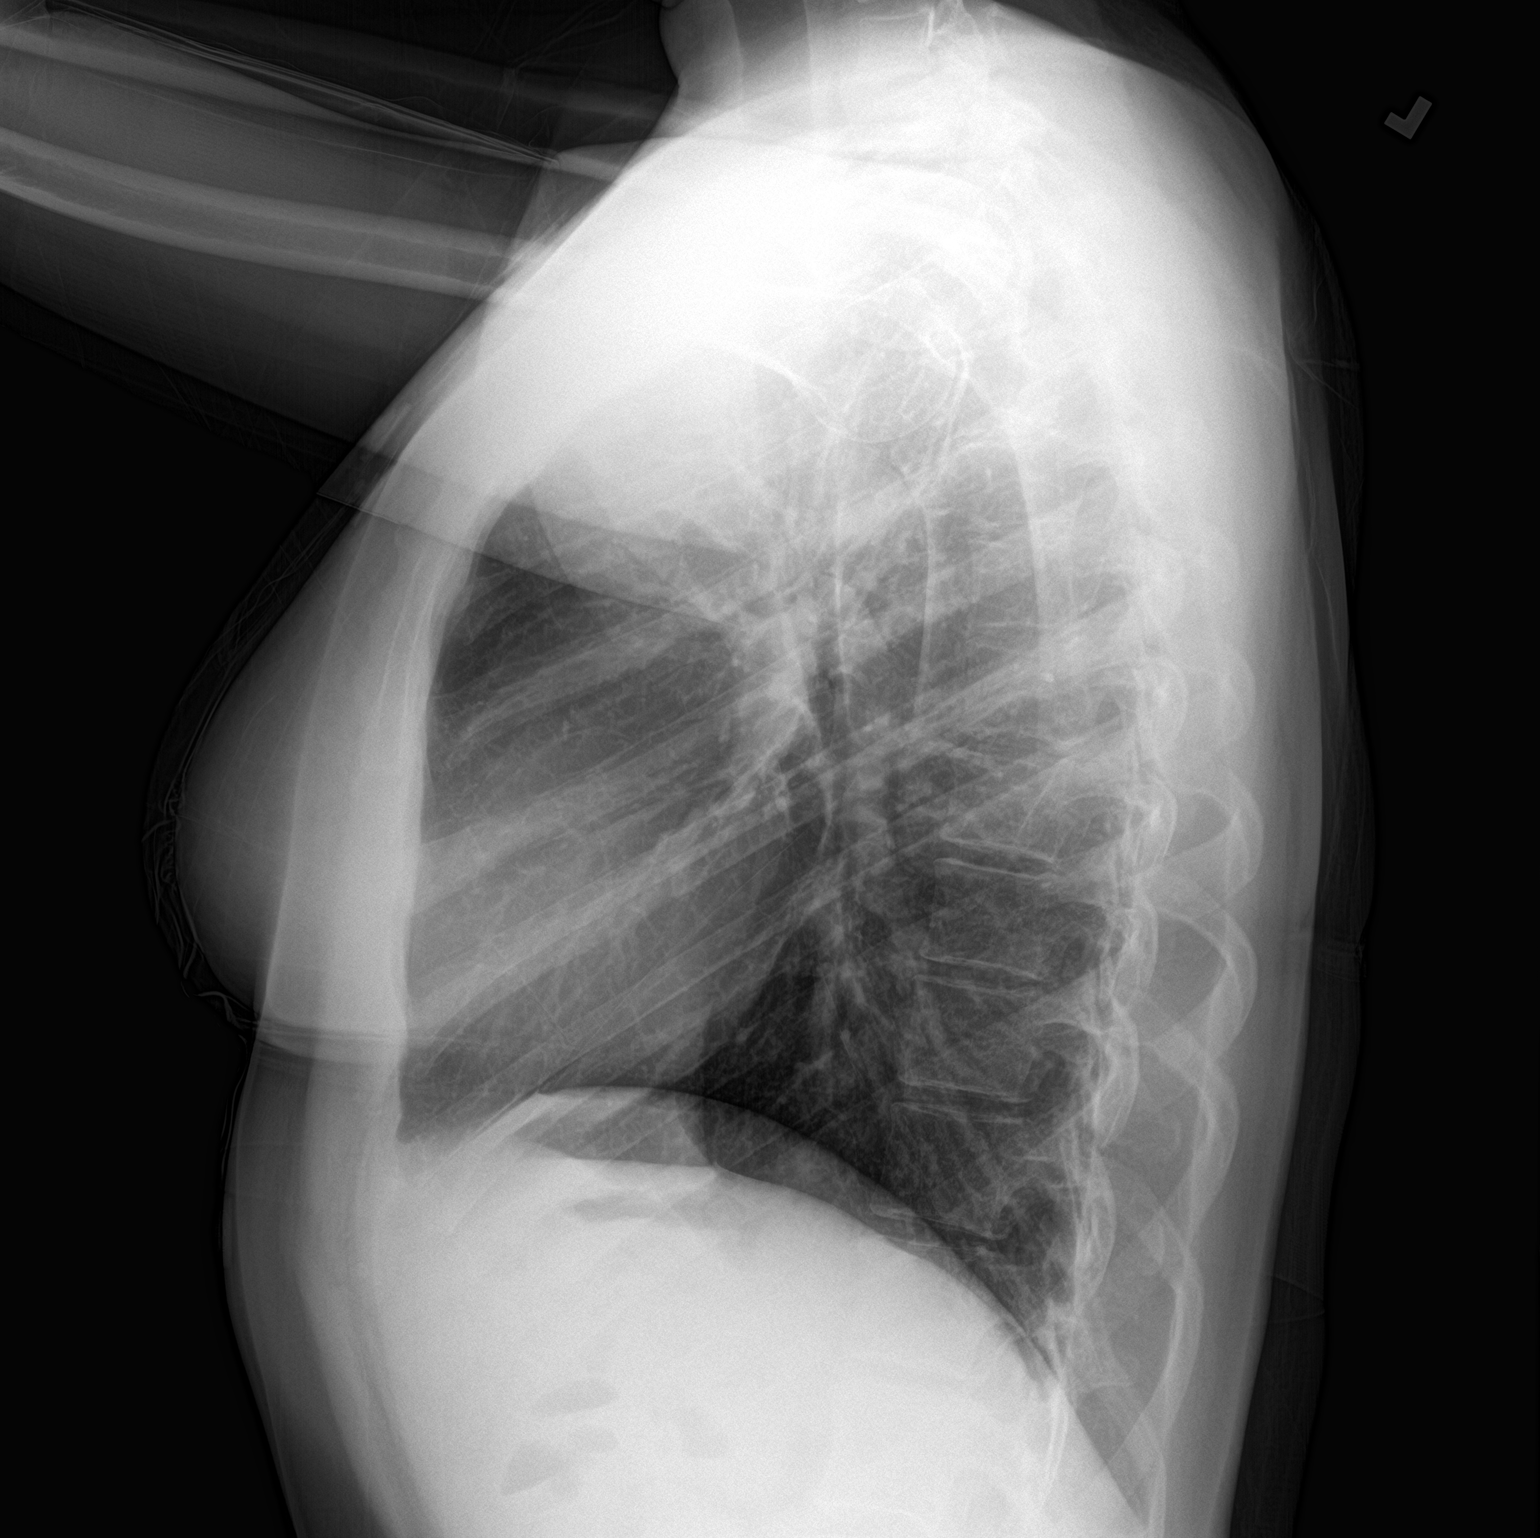

[2 of 2 positions shown; findings below may reference images not displayed]

FINDINGS: The heart size and mediastinal contours are within normal limits.
Both lungs are clear. The visualized skeletal structures are
unremarkable.
IMPRESSION: No active cardiopulmonary disease.

## 2022-10-21 ENCOUNTER — Ambulatory Visit: Payer: Self-pay | Admitting: Psychology

## 2022-10-21 DIAGNOSIS — F331 Major depressive disorder, recurrent, moderate: Secondary | ICD-10-CM

## 2022-10-22 ENCOUNTER — Ambulatory Visit (INDEPENDENT_AMBULATORY_CARE_PROVIDER_SITE_OTHER): Payer: Self-pay | Admitting: Psychology

## 2022-10-22 DIAGNOSIS — F331 Major depressive disorder, recurrent, moderate: Secondary | ICD-10-CM

## 2022-10-22 NOTE — Progress Notes (Signed)
[facility] Counselor/Therapist Progress Note  Patient ID: Tracy Munoz, MRN: [id number],    Date: 10/22/2022  Time Spent: 3:00pm - 3:55pm     55 minutes   Treatment Type: Individual Therapy  Reported Symptoms: stress  Mental Status Exam: Appearance:  Casual     Behavior: Appropriate  Motor: Normal  Speech/Language:  Normal Rate  Affect: Appropriate  Mood: normal  Thought process: normal  Thought content:   WNL  Sensory/Perceptual disturbances:   WNL  Orientation: oriented to person, place, time/date, and situation  Attention: Good  Concentration: Good  Memory: WNL  Fund of knowledge:  Good  Insight:   Good  Judgment:  Good  Impulse Control: Good   Risk Assessment: Danger to Self:  No Self-injurious Behavior: No Danger to Others: No Duty to Warn:no Physical Aggression / Violence:No  Access to Firearms a concern: No  Gang Involvement:No   Subjective: Pt present for face-to-face individual therapy via video Webex.  Pt consents to telehealth video session due to COVID 19 pandemic. Location of pt: home Location of therapist: home office.    Pt talked about her move to her grandmother's house.  She states the move went well.  Pt misses her friends but is adjusting.  Pt feels more energy and is not self medicating with alcohol.  She still has healing to do.  Helped pt process her feelings.   Pt has been feeling like she is not doing enough and is putting "shoulds" on herself that are not rational.   Addressed these issues and worked on thought reframing.   Addressed how pt can be kind and compassionate with herself and increase self care.   Provided supportive therapy.    Interventions: Cognitive Behavioral Therapy and Insight-Oriented  Diagnosis: F33.1  Plan: Plan of Care:  Recommend ongoing therapy.   Pt participated in setting treatment goals.  Plan to meet monthly.   Treatment Plan (Treatment Plan Target Date: 10/26/2022) Client  Abilities/Strengths  Pt is bright, engaging, and motivated for therapy.   Client Treatment Preferences  Individual therapy.  Client Statement of Needs  Improve coping skills.  Symptoms  Depressed or irritable mood. Low self-esteem.  Problems Addressed  Unipolar Depression Goals 1. Alleviate depressive symptoms and return to previous level of effective functioning. 2. Appropriately grieve the loss in order to normalize mood and to return to previously adaptive level of functioning. Objective Learn and implement behavioral strategies to overcome depression. Target Date: 2022-10-26 Frequency: Monthly  Progress: 50 Modality: individual  Related Interventions Engage the client in "behavioral activation," increasing his/her activity level and contact with sources of reward, while identifying processes that inhibit activation.  Use behavioral techniques such as instruction, rehearsal, role-playing, role reversal, as needed, to facilitate activity in the client's daily life; reinforce success. Assist the client in developing skills that increase the likelihood of deriving pleasure from behavioral activation (e.g., assertiveness skills, developing an exercise plan, less internal/more external focus, increased social involvement); reinforce success. Objective Identify important people in life, past and present, and describe the quality, good and poor, of those relationships. Target Date: 2022-10-26 Frequency: Monthly  Progress: 50 Modality: individual  Related Interventions Conduct Interpersonal Therapy beginning with the assessment of the client's "interpersonal inventory" of important past and present relationships; develop a case formulation linking depression to grief, interpersonal role disputes, role transitions, and/or interpersonal deficits). Objective Learn and implement problem-solving and decision-making skills. Target Date: 2022-10-26 Frequency: Monthly  Progress: 50 Modality:  individual  Related Interventions Conduct Problem-Solving Therapy  using techniques such as psychoeducation, modeling, and role-playing to teach client problem-solving skills (i.e., defining a problem specifically, generating possible solutions, evaluating the pros and cons of each solution, selecting and implementing a plan of action, evaluating the efficacy of the plan, accepting or revising the plan); role-play application of the problem-solving skill to a real life issue. Encourage in the client the development of a positive problem orientation in which problems and solving them are viewed as a natural part of life and not something to be feared, despaired, or avoided. 3. Develop healthy interpersonal relationships that lead to the alleviation and help prevent the relapse of depression. 4. Develop healthy thinking patterns and beliefs about self, others, and the world that lead to the alleviation and help prevent the relapse of depression. 5. Recognize, accept, and cope with feelings of depression. Diagnosis F33.1  Conditions For Discharge Achievement of treatment goals and objectives   Clint Bolder, LCSW

## 2022-11-18 ENCOUNTER — Ambulatory Visit (INDEPENDENT_AMBULATORY_CARE_PROVIDER_SITE_OTHER): Payer: Self-pay | Admitting: Psychology

## 2022-11-18 DIAGNOSIS — F331 Major depressive disorder, recurrent, moderate: Secondary | ICD-10-CM

## 2022-11-18 NOTE — Progress Notes (Signed)
[facility] Counselor/Therapist Progress Note  Patient ID: Brenay Neufer, MRN: [id number],    Date: 11/18/2022  Time Spent: 12:00pm - 12:55pm     55 minutes   Treatment Type: Individual Therapy  Reported Symptoms: stress  Mental Status Exam: Appearance:  Casual     Behavior: Appropriate  Motor: Normal  Speech/Language:  Normal Rate  Affect: Appropriate  Mood: normal  Thought process: normal  Thought content:   WNL  Sensory/Perceptual disturbances:   WNL  Orientation: oriented to person, place, time/date, and situation  Attention: Good  Concentration: Good  Memory: WNL  Fund of knowledge:  Good  Insight:   Good  Judgment:  Good  Impulse Control: Good   Risk Assessment: Danger to Self:  No Self-injurious Behavior: No Danger to Others: No Duty to Warn:no Physical Aggression / Violence:No  Access to Firearms a concern: No  Gang Involvement:No   Subjective: Pt present for face-to-face individual therapy via video Webex.  Pt consents to telehealth video session due to COVID 19 pandemic. Location of pt: home Location of therapist: home office.    Pt talked about this being her last therapy session bc she is moving to [place].  Reviewed the work pt has done in therapy and how much progress she has made.   Addressed how pt can find a therapist in [place].   Pt has been working on increasing her self care and is exercising several days a week.  She is going to church with her family.  Pt will be living with and taking care of her grandmother in [place].   Pt talked about her relationship with Casimiro Needle.  She is still healing from the breakup.   Addressed how pt can be kind and compassionate with herself and increase self care.   Provided supportive therapy.   This is pt's last session in therapy due to her move.    Interventions: Cognitive Behavioral Therapy and Insight-Oriented  Diagnosis: F33.1  Plan of Care:  Recommend ongoing therapy.   Pt  participated in setting treatment goals.   Treatment Plan (Treatment Plan Target Date: 10/26/2022) Client Abilities/Strengths  Pt is bright, engaging, and motivated for therapy.   Client Treatment Preferences  Individual therapy.  Client Statement of Needs  Improve coping skills.  Symptoms  Depressed or irritable mood. Low self-esteem.  Problems Addressed  Unipolar Depression Goals 1. Alleviate depressive symptoms and return to previous level of effective functioning. 2. Appropriately grieve the loss in order to normalize mood and to return to previously adaptive level of functioning. Objective Learn and implement behavioral strategies to overcome depression. Target Date: 2022-10-26 Frequency: Monthly  Progress: 50 Modality: individual  Related Interventions Engage the client in "behavioral activation," increasing his/her activity level and contact with sources of reward, while identifying processes that inhibit activation.  Use behavioral techniques such as instruction, rehearsal, role-playing, role reversal, as needed, to facilitate activity in the client's daily life; reinforce success. Assist the client in developing skills that increase the likelihood of deriving pleasure from behavioral activation (e.g., assertiveness skills, developing an exercise plan, less internal/more external focus, increased social involvement); reinforce success. Objective Identify important people in life, past and present, and describe the quality, good and poor, of those relationships. Target Date: 2022-10-26 Frequency: Monthly  Progress: 50 Modality: individual  Related Interventions Conduct Interpersonal Therapy beginning with the assessment of the client's "interpersonal inventory" of important past and present relationships; develop a case formulation linking depression to grief, interpersonal role disputes, role transitions, and/or interpersonal  deficits). Objective Learn and implement  problem-solving and decision-making skills. Target Date: 2022-10-26 Frequency: Monthly  Progress: 50 Modality: individual  Related Interventions Conduct Problem-Solving Therapy using techniques such as psychoeducation, modeling, and role-playing to teach client problem-solving skills (i.e., defining a problem specifically, generating possible solutions, evaluating the pros and cons of each solution, selecting and implementing a plan of action, evaluating the efficacy of the plan, accepting or revising the plan); role-play application of the problem-solving skill to a real life issue. Encourage in the client the development of a positive problem orientation in which problems and solving them are viewed as a natural part of life and not something to be feared, despaired, or avoided. 3. Develop healthy interpersonal relationships that lead to the alleviation and help prevent the relapse of depression. 4. Develop healthy thinking patterns and beliefs about self, others, and the world that lead to the alleviation and help prevent the relapse of depression. 5. Recognize, accept, and cope with feelings of depression. Diagnosis F33.1  Conditions For Discharge Achievement of treatment goals and objectives   Salomon Fick, LCSW

## 2023-01-16 ENCOUNTER — Telehealth: Payer: Self-pay | Admitting: Nurse Practitioner

## 2023-01-16 ENCOUNTER — Encounter: Payer: Self-pay | Admitting: Nurse Practitioner

## 2023-01-16 NOTE — Telephone Encounter (Signed)
1st no show, fee waived, letter sent via mail and mychart, text sent
